# Patient Record
Sex: Male | Born: 1965 | Race: White | Hispanic: No | Marital: Married | Smoking: Former smoker
Health system: Southern US, Community
[De-identification: ages and names within clinical notes are randomized; demographics above are authoritative.]

## PROBLEM LIST (undated history)

## (undated) DIAGNOSIS — F411 Generalized anxiety disorder: Secondary | ICD-10-CM

## (undated) DIAGNOSIS — Z86010 Personal history of colonic polyps: Secondary | ICD-10-CM

## (undated) DIAGNOSIS — Z8719 Personal history of other diseases of the digestive system: Secondary | ICD-10-CM

## (undated) DIAGNOSIS — F329 Major depressive disorder, single episode, unspecified: Secondary | ICD-10-CM

## (undated) DIAGNOSIS — C4491 Basal cell carcinoma of skin, unspecified: Secondary | ICD-10-CM

## (undated) DIAGNOSIS — E785 Hyperlipidemia, unspecified: Secondary | ICD-10-CM

## (undated) DIAGNOSIS — K644 Residual hemorrhoidal skin tags: Secondary | ICD-10-CM

## (undated) DIAGNOSIS — E790 Hyperuricemia without signs of inflammatory arthritis and tophaceous disease: Secondary | ICD-10-CM

## (undated) DIAGNOSIS — M109 Gout, unspecified: Secondary | ICD-10-CM

## (undated) HISTORY — DX: Hyperlipidemia, unspecified: E78.5

## (undated) HISTORY — DX: Generalized anxiety disorder: F41.1

## (undated) HISTORY — DX: Personal history of colonic polyps: Z86.010

## (undated) HISTORY — DX: Hyperuricemia without signs of inflammatory arthritis and tophaceous disease: E79.0

## (undated) HISTORY — DX: Major depressive disorder, single episode, unspecified: F32.9

## (undated) HISTORY — DX: Residual hemorrhoidal skin tags: K64.4

## (undated) HISTORY — PX: COLONOSCOPY: SHX174

## (undated) HISTORY — DX: Personal history of other diseases of the digestive system: Z87.19

## (undated) HISTORY — PX: POLYPECTOMY: SHX149

## (undated) HISTORY — DX: Gout, unspecified: M10.9

## (undated) HISTORY — DX: Basal cell carcinoma of skin, unspecified: C44.91

---

## 2005-10-01 ENCOUNTER — Ambulatory Visit: Payer: Self-pay | Admitting: Internal Medicine

## 2005-10-14 ENCOUNTER — Ambulatory Visit: Payer: Self-pay | Admitting: Internal Medicine

## 2006-03-13 ENCOUNTER — Ambulatory Visit: Payer: Self-pay | Admitting: Internal Medicine

## 2006-10-06 ENCOUNTER — Ambulatory Visit: Payer: Self-pay | Admitting: Internal Medicine

## 2006-10-07 DIAGNOSIS — F411 Generalized anxiety disorder: Secondary | ICD-10-CM

## 2006-10-07 DIAGNOSIS — Z8719 Personal history of other diseases of the digestive system: Secondary | ICD-10-CM | POA: Insufficient documentation

## 2006-10-07 HISTORY — DX: Generalized anxiety disorder: F41.1

## 2006-10-07 HISTORY — DX: Personal history of other diseases of the digestive system: Z87.19

## 2006-10-13 ENCOUNTER — Ambulatory Visit: Payer: Self-pay | Admitting: Internal Medicine

## 2006-10-13 LAB — CONVERTED CEMR LAB
ALT: 25 units/L (ref 0–53)
AST: 23 units/L (ref 0–37)
Albumin: 4.1 g/dL (ref 3.5–5.2)
Alkaline Phosphatase: 83 units/L (ref 39–117)
BUN: 12 mg/dL (ref 6–23)
Basophils Relative: 0.5 % (ref 0.0–1.0)
Crystals: NEGATIVE
Eosinophils Relative: 2 % (ref 0.0–5.0)
GFR calc Af Amer: 106 mL/min
GFR calc non Af Amer: 88 mL/min
HCT: 43.3 % (ref 39.0–52.0)
HDL: 22.9 mg/dL — ABNORMAL LOW (ref >39.0)
Hemoglobin: 15.1 g/dL (ref 13.0–17.0)
Lymphocytes Relative: 22.9 % (ref 12.0–46.0)
Mucus, UA: NEGATIVE
Neutro Abs: 4.6 10*3/uL (ref 1.4–7.7)
Platelets: 179 10*3/uL (ref 150–400)
Potassium: 4.2 meq/L (ref 3.5–5.1)
RBC / HPF: NONE SEEN
Total Bilirubin: 1 mg/dL (ref 0.3–1.2)
Total Protein: 6.8 g/dL (ref 6.0–8.3)
Urine Glucose: NEGATIVE mg/dL
Urobilinogen, UA: 0.2 (ref 0.0–1.0)
VLDL: 59 mg/dL — ABNORMAL HIGH (ref 0–40)
WBC: 6.6 10*3/uL (ref 4.5–10.5)

## 2007-07-10 ENCOUNTER — Ambulatory Visit: Payer: Self-pay | Admitting: Internal Medicine

## 2007-07-10 DIAGNOSIS — M109 Gout, unspecified: Secondary | ICD-10-CM | POA: Insufficient documentation

## 2007-07-10 DIAGNOSIS — Z860100 Personal history of colon polyps, unspecified: Secondary | ICD-10-CM

## 2007-07-10 HISTORY — DX: Personal history of colonic polyps: Z86.010

## 2007-07-10 HISTORY — DX: Personal history of colon polyps, unspecified: Z86.0100

## 2007-07-10 HISTORY — DX: Gout, unspecified: M10.9

## 2007-12-26 ENCOUNTER — Telehealth (INDEPENDENT_AMBULATORY_CARE_PROVIDER_SITE_OTHER): Payer: Self-pay | Admitting: *Deleted

## 2008-02-05 ENCOUNTER — Ambulatory Visit: Payer: Self-pay | Admitting: Gastroenterology

## 2008-03-17 ENCOUNTER — Ambulatory Visit: Payer: Self-pay | Admitting: Gastroenterology

## 2008-03-17 ENCOUNTER — Encounter: Payer: Self-pay | Admitting: Gastroenterology

## 2008-03-17 LAB — HM COLONOSCOPY

## 2008-03-19 ENCOUNTER — Encounter: Payer: Self-pay | Admitting: Gastroenterology

## 2008-09-03 ENCOUNTER — Telehealth: Payer: Self-pay | Admitting: Internal Medicine

## 2008-12-29 ENCOUNTER — Ambulatory Visit: Payer: Self-pay | Admitting: Internal Medicine

## 2008-12-29 DIAGNOSIS — F3289 Other specified depressive episodes: Secondary | ICD-10-CM

## 2008-12-29 DIAGNOSIS — F329 Major depressive disorder, single episode, unspecified: Secondary | ICD-10-CM

## 2008-12-29 DIAGNOSIS — F324 Major depressive disorder, single episode, in partial remission: Secondary | ICD-10-CM | POA: Insufficient documentation

## 2008-12-29 HISTORY — DX: Major depressive disorder, single episode, unspecified: F32.9

## 2008-12-29 HISTORY — DX: Other specified depressive episodes: F32.89

## 2008-12-29 LAB — CONVERTED CEMR LAB
Albumin: 4.3 g/dL (ref 3.5–5.2)
Alkaline Phosphatase: 93 units/L (ref 39–117)
Basophils Absolute: 0.1 10*3/uL (ref 0.0–0.1)
Bilirubin Urine: NEGATIVE
CO2: 31 meq/L (ref 19–32)
Calcium: 9.6 mg/dL (ref 8.4–10.5)
Creatinine, Ser: 0.9 mg/dL (ref 0.4–1.5)
Eosinophils Absolute: 0.2 10*3/uL (ref 0.0–0.7)
Glucose, Bld: 102 mg/dL — ABNORMAL HIGH (ref 70–99)
HDL: 23.8 mg/dL — ABNORMAL LOW (ref >39.00)
Hemoglobin: 16.4 g/dL (ref 13.0–17.0)
Ketones, ur: NEGATIVE mg/dL
Leukocytes, UA: NEGATIVE
Lymphocytes Relative: 23.6 % (ref 12.0–46.0)
MCHC: 35.9 g/dL (ref 30.0–36.0)
MCV: 83.3 fL (ref 78.0–100.0)
Monocytes Absolute: 0.7 10*3/uL (ref 0.1–1.0)
Neutro Abs: 5.4 10*3/uL (ref 1.4–7.7)
Neutrophils Relative %: 65.1 % (ref 43.0–77.0)
RDW: 12.9 % (ref 11.5–14.6)
Specific Gravity, Urine: 1.025 (ref 1.000–1.030)
TSH: 3.29 microintl units/mL (ref 0.35–5.50)
Total Protein, Urine: NEGATIVE mg/dL
Triglycerides: 582 mg/dL — ABNORMAL HIGH (ref 0.0–149.0)
Urine Glucose: NEGATIVE mg/dL
VLDL: 116.4 mg/dL — ABNORMAL HIGH (ref 0.0–40.0)
pH: 5.5 (ref 5.0–8.0)

## 2009-02-12 ENCOUNTER — Telehealth: Payer: Self-pay | Admitting: Internal Medicine

## 2009-03-02 ENCOUNTER — Telehealth: Payer: Self-pay | Admitting: Internal Medicine

## 2009-04-29 ENCOUNTER — Telehealth: Payer: Self-pay | Admitting: Internal Medicine

## 2009-06-03 ENCOUNTER — Telehealth: Payer: Self-pay | Admitting: Internal Medicine

## 2009-06-30 ENCOUNTER — Telehealth: Payer: Self-pay | Admitting: Internal Medicine

## 2009-09-03 ENCOUNTER — Encounter: Payer: Self-pay | Admitting: Internal Medicine

## 2009-09-11 ENCOUNTER — Telehealth: Payer: Self-pay | Admitting: Internal Medicine

## 2010-04-06 NOTE — Progress Notes (Signed)
Summary: RX request  Phone Note Call from Patient Call back at Home Phone [phone]   Caller: Patient Summary of Call: pt called requesting 90 day supply of generic Zoloft to [facility] [address] to cut cost. Initial call taken by: Margaret Pyle, CMA,  April 29, 2009 11:45 AM  Follow-up for Phone Call        ok - to robin to handle Follow-up by: Corwin Levins MD,  April 29, 2009 1:11 PM  Additional Follow-up for Phone Call Additional follow up Details #1::        rx faxed to pharmacy Additional Follow-up by: Margaret Pyle, CMA,  April 29, 2009 2:38 PM    Prescriptions: SERTRALINE HCL 50 MG TABS (SERTRALINE HCL) 1 by mouth once daily  #90 x 2   Entered by:   Margaret Pyle, CMA   Authorized by:   Corwin Levins MD   Signed by:   Margaret Pyle, CMA on 04/29/2009   Method used:   Printed then faxed to ...       [facility] [phone] (retail)       [address]       [place], [place]  [address]       Ph: [phone]       Fax:    RxID:   [id number]

## 2010-04-06 NOTE — Progress Notes (Signed)
Summary: rx request  Phone Note Call from Patient Call back at Home Phone [phone]   Caller: Patient Summary of Call: pt left msg on triage requesting refill for Clonazapam to be sent in to [facility] pharmacy on [facility]. please advise Initial call taken by: Margaret Pyle, CMA,  June 03, 2009 10:21 AM/ Sydell Axon SMA  Follow-up for Phone Call        called pt and informed him that rx could be picked up, left rx in cabinet Follow-up by: Margaret Pyle, CMA,  June 03, 2009 1:57 PM/ Sydell Axon SMA    New/Updated Medications: CLONAZEPAM 0.5 MG TABS (CLONAZEPAM) 1 by mouth qam and 2 by mouth qpm as needed Prescriptions: CLONAZEPAM 0.5 MG TABS (CLONAZEPAM) 1 by mouth qam and 2 by mouth qpm as needed  #90 x 2   Entered and Authorized by:   Corwin Levins MD   Signed by:   Corwin Levins MD on 06/03/2009   Method used:   Print then Give to Patient   RxID:   [id number]  done hardcopy to LIM side B - dahlia  Corwin Levins MD  June 03, 2009 1:30 PM

## 2010-04-06 NOTE — Medication Information (Signed)
Summary: Rx Refill request under new insurance/Patient  Rx Refill request under new insurance/Patient   Imported By: Sherian Rein 09/10/2009 13:40:06  _____________________________________________________________________  External Attachment:    Type:   Image     Comment:   External Document

## 2010-04-06 NOTE — Progress Notes (Signed)
Summary: Rx refill req  Phone Note Refill Request Message from:  Pharmacy on September 11, 2009 2:20 PM  Refills Requested: Medication #1:  ZOLOFT 100 MG TABS 1  and 1/2 by mouth once daily.   Dosage confirmed as above?Dosage Confirmed  Method Requested: Electronic Initial call taken by: Margaret Pyle, CMA,  September 11, 2009 2:20 PM    New/Updated Medications: SERTRALINE HCL 100 MG TABS (SERTRALINE HCL) 1 and 1/2 by mouth once daily Prescriptions: SERTRALINE HCL 100 MG TABS (SERTRALINE HCL) 1 and 1/2 by mouth once daily  #135 x 2   Entered by:   Margaret Pyle, CMA   Authorized by:   Corwin Levins MD   Signed by:   Margaret Pyle, CMA on 09/11/2009   Method used:   Faxed to ...       [facility] (mail-order)             , [place]         Ph: [phone]       Fax: [phone]   RxID:   [id number]

## 2010-04-06 NOTE — Progress Notes (Signed)
Summary: Rx change?  Phone Note Call from Patient   Caller: Patient [phone] Summary of Call: Pt called requesting Rx for Setralline be increased to correct dosage, 100mg  as per phone note 02/12/2009. Okay to change and Rx to [facility] [address]? Initial call taken by: Margaret Pyle, CMA,  June 30, 2009 11:49 AM  Follow-up for Phone Call        done escript Follow-up by: Corwin Levins MD,  June 30, 2009 12:45 PM  Additional Follow-up for Phone Call Additional follow up Details #1::        pt informed Additional Follow-up by: Margaret Pyle, CMA,  June 30, 2009 1:16 PM    New/Updated Medications: ZOLOFT 100 MG TABS (SERTRALINE HCL) 1  and 1/2 by mouth once daily Prescriptions: ZOLOFT 100 MG TABS (SERTRALINE HCL) 1  and 1/2 by mouth once daily  #45 x 11   Entered and Authorized by:   Corwin Levins MD   Signed by:   Corwin Levins MD on 06/30/2009   Method used:   Electronically to        [facility] [address] [address] (retail)       [address]       [place], [place]  [address]       Ph: [phone]       Fax: [phone]   RxID:   [id number] ZOLOFT 100 MG TABS (SERTRALINE HCL) 1  and 1/2 by mouth once daily  #45 x 11   Entered and Authorized by:   Corwin Levins MD   Signed by:   Corwin Levins MD on 06/30/2009   Method used:   [facility]ronically [facility]        [facility]  [address] [phone] (retail[facility]       [address]       [place], [place]  [id number]       Ph: [phone] or [phone]       Fax: [phone]   RxID:   [id number]

## 2010-11-02 ENCOUNTER — Encounter: Payer: Self-pay | Admitting: Internal Medicine

## 2010-11-02 DIAGNOSIS — Z0001 Encounter for general adult medical examination with abnormal findings: Secondary | ICD-10-CM | POA: Insufficient documentation

## 2010-11-05 ENCOUNTER — Ambulatory Visit (INDEPENDENT_AMBULATORY_CARE_PROVIDER_SITE_OTHER): Payer: BC Managed Care – PPO | Admitting: Internal Medicine

## 2010-11-05 ENCOUNTER — Other Ambulatory Visit (INDEPENDENT_AMBULATORY_CARE_PROVIDER_SITE_OTHER): Payer: BC Managed Care – PPO

## 2010-11-05 ENCOUNTER — Encounter: Payer: Self-pay | Admitting: Internal Medicine

## 2010-11-05 ENCOUNTER — Other Ambulatory Visit: Payer: Self-pay | Admitting: Internal Medicine

## 2010-11-05 VITALS — BP 100/62 | HR 77 | Temp 98.9°F | Ht 73.0 in | Wt 266.1 lb

## 2010-11-05 DIAGNOSIS — G4700 Insomnia, unspecified: Secondary | ICD-10-CM | POA: Insufficient documentation

## 2010-11-05 DIAGNOSIS — Z0000 Encounter for general adult medical examination without abnormal findings: Secondary | ICD-10-CM

## 2010-11-05 DIAGNOSIS — F329 Major depressive disorder, single episode, unspecified: Secondary | ICD-10-CM

## 2010-11-05 DIAGNOSIS — M109 Gout, unspecified: Secondary | ICD-10-CM

## 2010-11-05 LAB — TSH: TSH: 1.03 u[IU]/mL (ref 0.35–5.50)

## 2010-11-05 LAB — URINALYSIS, ROUTINE W REFLEX MICROSCOPIC
Bilirubin Urine: NEGATIVE
Hgb urine dipstick: NEGATIVE
Nitrite: NEGATIVE
Specific Gravity, Urine: 1.02 (ref 1.000–1.030)
Total Protein, Urine: NEGATIVE
Urine Glucose: NEGATIVE
Urobilinogen, UA: 0.2 (ref 0.0–1.0)
pH: 5.5 (ref 5.0–8.0)

## 2010-11-05 LAB — CBC WITH DIFFERENTIAL/PLATELET
Basophils Relative: 0.7 % (ref 0.0–3.0)
Eosinophils Relative: 2 % (ref 0.0–5.0)
HCT: 42.7 % (ref 39.0–52.0)
Hemoglobin: 14.5 g/dL (ref 13.0–17.0)
Lymphs Abs: 1.7 10*3/uL (ref 0.7–4.0)
Monocytes Relative: 5.2 % (ref 3.0–12.0)
Neutro Abs: 5.2 10*3/uL (ref 1.4–7.7)
RBC: 5.12 Mil/uL (ref 4.22–5.81)
WBC: 7.5 10*3/uL (ref 4.5–10.5)

## 2010-11-05 LAB — LIPID PANEL
HDL: 27.6 mg/dL — ABNORMAL LOW (ref >39.00)
Triglycerides: 401 mg/dL — ABNORMAL HIGH (ref 0.0–149.0)
VLDL: 80.2 mg/dL — ABNORMAL HIGH (ref 0.0–40.0)

## 2010-11-05 LAB — BASIC METABOLIC PANEL
BUN: 16 mg/dL (ref 6–23)
Calcium: 9.7 mg/dL (ref 8.4–10.5)
GFR: 83.78 mL/min (ref >60.00)
Glucose, Bld: 102 mg/dL — ABNORMAL HIGH (ref 70–99)

## 2010-11-05 LAB — HEPATIC FUNCTION PANEL: Albumin: 4.3 g/dL (ref 3.5–5.2)

## 2010-11-05 LAB — LDL CHOLESTEROL, DIRECT: Direct LDL: 56.9 mg/dL

## 2010-11-05 MED ORDER — SERTRALINE HCL 100 MG PO TABS: ORAL_TABLET | ORAL | Status: DC

## 2010-11-05 MED ORDER — ZOLPIDEM TARTRATE 10 MG PO TABS: 10.0000 mg | ORAL_TABLET | Freq: Every evening | ORAL | Status: DC | PRN

## 2010-11-05 MED ORDER — INDOMETHACIN 50 MG PO CAPS: 50.0000 mg | ORAL_CAPSULE | Freq: Three times a day (TID) | ORAL | Status: AC

## 2010-11-05 NOTE — Patient Instructions (Signed)
Take all new medications as prescribed - the zoloft for nerves, ambien for sleep, and indocin as needed for gout Continue all other medications as before, except stop the clonazepam as you have Please go to LAB in the Basement for the blood and/or urine tests to be done today Please call the phone number [phone] (the PhoneTree System) for results of testing in 2-3 days;  When calling, simply dial the number, and when prompted enter the MRN number above (the Medical Record Number) and the # key, then the message should start. Please return in 1 year for your yearly visit, or sooner if needed, with Lab testing done 3-5 days before

## 2010-11-05 NOTE — Progress Notes (Signed)
Subjective:    Patient ID: Corey Garrett, male    DOB: 05-10-1965, 45 y.o.   MRN: [id number]  HPI Here for wellness and f/u;  Overall doing ok;  Pt denies CP, worsening SOB, DOE, wheezing, orthopnea, PND, worsening LE edema, palpitations, dizziness or syncope.  Pt denies neurological change such as new Headache, facial or extremity weakness.  Pt denies polydipsia, polyuria, or low sugar symptoms. Pt states overall good compliance with treatment and medications, good tolerability, and trying to follow lower cholesterol diet.  Pt denies worsening depressive symptoms, suicidal ideation or panic. No fever, wt loss, night sweats, loss of appetite, or other constitutional symptoms.  Pt states good ability with ADL's, low fall risk, home safety reviewed and adequate, no significant changes in hearing or vision, and occasionally active with exercise. Denies worsening depressive symptoms, suicidal ideation, or panic, though has ongoing anxiety, not increased recently, actually less now that he is back to working in the last 4 months after being out of work for 2 yrs.  Did have gout attack recent in [place] with increased protein intake (fish and steak) and mild ETOH use.   Past Medical History  Diagnosis Date  . ANXIETY 10/07/2006  . COLONIC POLYPS, HX OF 07/10/2007  . DEPRESSION 12/29/2008  . GOUT 07/10/2007  . HEMORRHOIDS, HX OF 10/07/2006   No past surgical history on file.  reports that he has quit smoking. He does not have any smokeless tobacco history on file. He reports that he does not drink alcohol. His drug history not on file. family history includes Anxiety disorder in his other and Autism in his daughter. No Known Allergies No current outpatient prescriptions on file prior to visit.   Review of Systems Review of Systems  Constitutional: Negative for diaphoresis, activity change, appetite change and unexpected weight change.  HENT: Negative for hearing loss, ear pain, facial swelling,  mouth sores and neck stiffness.   Eyes: Negative for pain, redness and visual disturbance.  Respiratory: Negative for shortness of breath and wheezing.   Cardiovascular: Negative for chest pain and palpitations.  Gastrointestinal: Negative for diarrhea, blood in stool, abdominal distention and rectal pain.  Genitourinary: Negative for hematuria, flank pain and decreased urine volume.  Musculoskeletal: Negative for myalgias and joint swelling.  Skin: Negative for color change and wound.  Neurological: Negative for syncope and numbness.  Hematological: Negative for adenopathy.  Psychiatric/Behavioral: Negative for hallucinations, self-injury, decreased concentration and agitation.      Objective:   Physical Exam BP 100/62  Pulse 77  Temp(Src) 98.9 F (37.2 C) (Oral)  Ht 6\' 1"  (1.854 m)  Wt 266 lb 2 oz (120.714 kg)  BMI 35.11 kg/m2  SpO2 98% Physical Exam  VS noted, obese Constitutional: Pt is oriented to person, place, and time. Appears well-developed and well-nourished.  HENT:  Head: Normocephalic and atraumatic.  Right Ear: External ear normal.  Left Ear: External ear normal.  Nose: Nose normal.  Mouth/Throat: Oropharynx is clear and moist.  Eyes: Conjunctivae and EOM are normal. Pupils are equal, round, and reactive to light.  Neck: Normal range of motion. Neck supple. No JVD present. No tracheal deviation present.  Cardiovascular: Normal rate, regular rhythm, normal heart sounds and intact distal pulses.   Pulmonary/Chest: Effort normal and breath sounds normal.  Abdominal: Soft. Bowel sounds are normal. There is no tenderness.  Musculoskeletal: Normal range of motion. Exhibits no edema.  Lymphadenopathy:  Has no cervical adenopathy.  Neurological: Pt is alert and oriented to person,  place, and time. Pt has normal reflexes. No cranial nerve deficit.  Skin: Skin is warm and dry. No rash noted.  Psychiatric:  Behavior is normal.  1+ nervous, somewhat dysphoric           Assessment & Plan:

## 2010-11-05 NOTE — Assessment & Plan Note (Signed)

## 2011-02-28 ENCOUNTER — Telehealth: Payer: Self-pay

## 2011-02-28 DIAGNOSIS — F329 Major depressive disorder, single episode, unspecified: Secondary | ICD-10-CM

## 2011-02-28 MED ORDER — SERTRALINE HCL 100 MG PO TABS: ORAL_TABLET | ORAL | Status: DC

## 2011-02-28 NOTE — Telephone Encounter (Signed)
Called patient left message prescription request has been taken care of and sent to pharmacy. Informed as well MD's instructions regarding Benadryl qhs as well as the ambien prn.

## 2011-02-28 NOTE — Telephone Encounter (Signed)
Pt called stating that Sertraline and Ambien are not helping as much as he was hoping.  He would like to increase sertraline to 2 full tablets a day and says that he sometimes has to take benadryl with ambien for sleep. Please advise.

## 2011-02-28 NOTE — Telephone Encounter (Signed)
Ok to incr the sertraline as he suggests (done per emr)  Ok for benadryl qhs prn as well as the [place] prn

## 2011-05-16 ENCOUNTER — Other Ambulatory Visit: Payer: Self-pay

## 2011-05-16 MED ORDER — ZOLPIDEM TARTRATE 10 MG PO TABS: 10.0000 mg | ORAL_TABLET | Freq: Every evening | ORAL | Status: DC | PRN

## 2011-05-16 NOTE — Telephone Encounter (Signed)
Faxed hardcopy to pharmacy. 

## 2011-05-16 NOTE — Telephone Encounter (Signed)
Done hardcopy to robin  

## 2011-08-22 ENCOUNTER — Other Ambulatory Visit: Payer: Self-pay

## 2011-08-22 MED ORDER — INDOMETHACIN 50 MG PO CAPS: 50.0000 mg | ORAL_CAPSULE | Freq: Three times a day (TID) | ORAL | Status: DC

## 2012-01-02 ENCOUNTER — Encounter: Payer: Self-pay | Admitting: Internal Medicine

## 2012-01-02 ENCOUNTER — Ambulatory Visit (INDEPENDENT_AMBULATORY_CARE_PROVIDER_SITE_OTHER): Payer: BC Managed Care – PPO | Admitting: Internal Medicine

## 2012-01-02 ENCOUNTER — Other Ambulatory Visit: Payer: Self-pay | Admitting: Internal Medicine

## 2012-01-02 ENCOUNTER — Other Ambulatory Visit (INDEPENDENT_AMBULATORY_CARE_PROVIDER_SITE_OTHER): Payer: BC Managed Care – PPO

## 2012-01-02 VITALS — BP 108/70 | HR 79 | Temp 98.3°F | Ht 74.0 in | Wt 262.1 lb

## 2012-01-02 DIAGNOSIS — L989 Disorder of the skin and subcutaneous tissue, unspecified: Secondary | ICD-10-CM

## 2012-01-02 DIAGNOSIS — Z0000 Encounter for general adult medical examination without abnormal findings: Secondary | ICD-10-CM

## 2012-01-02 DIAGNOSIS — F329 Major depressive disorder, single episode, unspecified: Secondary | ICD-10-CM

## 2012-01-02 DIAGNOSIS — G4700 Insomnia, unspecified: Secondary | ICD-10-CM

## 2012-01-02 DIAGNOSIS — G4710 Hypersomnia, unspecified: Secondary | ICD-10-CM

## 2012-01-02 LAB — CBC WITH DIFFERENTIAL/PLATELET
Basophils Absolute: 0 10*3/uL (ref 0.0–0.1)
Eosinophils Relative: 2.2 % (ref 0.0–5.0)
HCT: 46.8 % (ref 39.0–52.0)
Lymphocytes Relative: 18.5 % (ref 12.0–46.0)
Monocytes Relative: 5.9 % (ref 3.0–12.0)
Neutrophils Relative %: 72.9 % (ref 43.0–77.0)
Platelets: 202 10*3/uL (ref 150.0–400.0)
WBC: 8.3 10*3/uL (ref 4.5–10.5)

## 2012-01-02 LAB — URINALYSIS, ROUTINE W REFLEX MICROSCOPIC
Nitrite: NEGATIVE
Specific Gravity, Urine: 1.015 (ref 1.000–1.030)
Urobilinogen, UA: 0.2 (ref 0.0–1.0)
pH: 6 (ref 5.0–8.0)

## 2012-01-02 LAB — PSA: PSA: 0.58 ng/mL (ref 0.10–4.00)

## 2012-01-02 LAB — HEPATIC FUNCTION PANEL
AST: 20 U/L (ref 0–37)
Albumin: 4.1 g/dL (ref 3.5–5.2)
Total Protein: 7.4 g/dL (ref 6.0–8.3)

## 2012-01-02 LAB — BASIC METABOLIC PANEL
BUN: 13 mg/dL (ref 6–23)
CO2: 30 mEq/L (ref 19–32)
GFR: 92.73 mL/min (ref >60.00)
Glucose, Bld: 97 mg/dL (ref 70–99)
Potassium: 4.4 mEq/L (ref 3.5–5.1)

## 2012-01-02 LAB — TSH: TSH: 1.19 u[IU]/mL (ref 0.35–5.50)

## 2012-01-02 LAB — LIPID PANEL
HDL: 24.1 mg/dL — ABNORMAL LOW (ref >39.00)
Triglycerides: 559 mg/dL — ABNORMAL HIGH (ref 0.0–149.0)

## 2012-01-02 LAB — LDL CHOLESTEROL, DIRECT: Direct LDL: 62.5 mg/dL

## 2012-01-02 MED ORDER — ZOLPIDEM TARTRATE 10 MG PO TABS: 10.0000 mg | ORAL_TABLET | Freq: Every evening | ORAL | Status: DC | PRN

## 2012-01-02 MED ORDER — SERTRALINE HCL 100 MG PO TABS: ORAL_TABLET | ORAL | Status: DC

## 2012-01-02 MED ORDER — FENOFIBRATE 160 MG PO TABS: 160.0000 mg | ORAL_TABLET | Freq: Every day | ORAL | Status: DC

## 2012-01-02 MED ORDER — INDOMETHACIN 50 MG PO CAPS: 50.0000 mg | ORAL_CAPSULE | Freq: Three times a day (TID) | ORAL | Status: DC

## 2012-01-02 NOTE — Addendum Note (Signed)
Addended by: Corwin Levins on: 01/02/2012 12:43 PM   Modules accepted: Orders

## 2012-01-02 NOTE — Assessment & Plan Note (Signed)
?   Basal cell vs other - for derm referral

## 2012-01-02 NOTE — Progress Notes (Signed)
Subjective:    Patient ID: Corey Garrett, male    DOB: 10/04/1965, 46 y.o.   MRN: [id number]  HPI  Here for wellness and f/u;  Overall doing ok;  Pt denies CP, worsening SOB, DOE, wheezing, orthopnea, PND, worsening LE edema, palpitations, dizziness or syncope.  Pt denies neurological change such as new Headache, facial or extremity weakness.  Pt denies polydipsia, polyuria, or low sugar symptoms. Pt states overall good compliance with treatment and medications, good tolerability, and trying to follow lower cholesterol diet.  Pt denies worsening depressive symptoms, suicidal ideation or panic. No fever, wt loss, night sweats, loss of appetite, or other constitutional symptoms.  Pt states good ability with ADL's, low fall risk, home safety reviewed and adequate, no significant changes in hearing or vision, and occasionally active with exercise.  Has had more stress financially though as son has college covered.  Has lost wt with the stress and admits to ongoing lack of exercise and less than optimal diet.  Travels a lot with higher fat diet with a collegue.  Has not been taking the zoloft 200 qd regularly though plans to try to do better with compliance, has had increased ? Depressed, but also Does have sense of ongoing fatigue, but has signficant hypersomnolence daytime as well.  Works at home, tends to sit in the home office on the computer for many hrs per day.  Snoring and thrashing on his part causes him to sleep about 75% in a different room at night Past Medical History  Diagnosis Date  . ANXIETY 10/07/2006  . COLONIC POLYPS, HX OF 07/10/2007  . DEPRESSION 12/29/2008  . GOUT 07/10/2007  . HEMORRHOIDS, HX OF 10/07/2006   No past surgical history on file.  reports that he has quit smoking. He does not have any smokeless tobacco history on file. He reports that he does not drink alcohol. His drug history not on file. family history includes Anxiety disorder in his other and Autism in his daughter. No  Known Allergies Current Outpatient Prescriptions on File Prior to Visit  Medication Sig Dispense Refill  . indomethacin (INDOCIN) 50 MG capsule Take 1 capsule (50 mg total) by mouth 3 (three) times daily with meals.  90 capsule  0  . sertraline (ZOLOFT) 100 MG tablet 2 tabs by mouth per day  150 tablet  3  . zolpidem (AMBIEN) 10 MG tablet Take 1 tablet (10 mg total) by mouth at bedtime as needed for sleep.  30 tablet  5  . zolpidem (AMBIEN) 10 MG tablet Take 1 tablet (10 mg total) by mouth at bedtime as needed for sleep.  30 tablet  5  . DISCONTD: sertraline (ZOLOFT) 100 MG tablet 1 and 1/2 by mouth once daily  180 tablet  3   Review of Systems Review of Systems  Constitutional: Negative for diaphoresis, activity change, appetite change and unexpected weight change.  HENT: Negative for hearing loss, ear pain, facial swelling, mouth sores and neck stiffness.   Eyes: Negative for pain, redness and visual disturbance.  Respiratory: Negative for shortness of breath and wheezing.   Cardiovascular: Negative for chest pain and palpitations.  Gastrointestinal: Negative for diarrhea, blood in stool, abdominal distention and rectal pain.  Genitourinary: Negative for hematuria, flank pain and decreased urine volume.  Musculoskeletal: Negative for myalgias and joint swelling.  Skin: Negative for color change and wound.  Neurological: Negative for syncope and numbness.  Hematological: Negative for adenopathy.  Psychiatric/Behavioral: Negative for hallucinations, self-injury, decreased concentration and  agitation.      Objective:   Physical Exam BP 108/70  Pulse 79  Temp 98.3 F (36.8 C) (Oral)  Ht 6\' 2"  (1.88 m)  Wt 262 lb 2 oz (118.899 kg)  BMI 33.65 kg/m2  SpO2 95% Physical Exam  VS noted Constitutional: Pt is oriented to person, place, and time. Appears well-developed and well-nourished. Lavella Lemons HENT:  Head: Normocephalic and atraumatic.  Right Ear: External ear normal.  Left Ear:  External ear normal.  Nose: Nose normal.  Mouth/Throat: Oropharynx is clear and moist.  Eyes: Conjunctivae and EOM are normal. Pupils are equal, round, and reactive to light.  Neck: Normal range of motion. Neck supple. No JVD present. No tracheal deviation present.  Cardiovascular: Normal rate, regular rhythm, normal heart sounds and intact distal pulses.   Pulmonary/Chest: Effort normal and breath sounds normal.  Abdominal: Soft. Bowel sounds are normal. There is no tenderness.  Musculoskeletal: Normal range of motion. Exhibits no edema.  Lymphadenopathy:  Has no cervical adenopathy.  Neurological: Pt is alert and oriented to person, place, and time. Pt has normal reflexes. No cranial nerve deficit.  Skin: Skin is warm and dry. No rash noted.  Skin lesion approx 1 cm, raised, scaly ? Mild irritated/ulcerated to right mid back below the scapula Psychiatric:  Has  Dysphoric mood and affect. Behavior is normal.     Assessment & Plan:

## 2012-01-02 NOTE — Assessment & Plan Note (Signed)
To re-start the zoloft, declines counseling, verified nonsuicidal

## 2012-01-02 NOTE — Patient Instructions (Addendum)
Please Continue all other medications as before Your [facility] You will be contacted regarding the referral for: dermatology, and pulmonary Please go to LAB in the Basement for the blood and/or urine tests to be done today You will be contacted by phone if any changes need to be made immediately.  Otherwise, you will receive a letter about your results with an explanation. Please remember to sign up for My Chart at your earliest convenience, as this will be important to you in the future with finding out test results. Please return in 1 year for your yearly visit, or sooner if needed, with Lab testing done 3-5 days before  OK to cancel the Jan 2014 appt

## 2012-01-02 NOTE — Assessment & Plan Note (Signed)
Overall doing well, age appropriate education and counseling updated, referrals for preventative services and immunizations addressed, dietary and smoking counseling addressed, most recent labs and ECG reviewed.  I have personally reviewed and have noted: 1) the patient's medical and social history 2) The pt's use of alcohol, tobacco, and illicit drugs 3) The patient's current medications and supplements 4) Functional ability including ADL's, fall risk, home safety risk, hearing and visual impairment 5) Diet and physical activities 6) Evidence for depression or mood disorder 7) The patient's height, weight, and BMI have been recorded in the chart I have made referrals, and provided counseling and education based on review of the above For labs today, o/w up to date

## 2012-01-02 NOTE — Assessment & Plan Note (Signed)
?   [facility] vs plmd - for pulm referral

## 2012-01-04 ENCOUNTER — Other Ambulatory Visit: Payer: Self-pay

## 2012-01-05 ENCOUNTER — Institutional Professional Consult (permissible substitution): Payer: BC Managed Care – PPO | Admitting: Internal Medicine

## 2012-02-13 ENCOUNTER — Institutional Professional Consult (permissible substitution): Payer: BC Managed Care – PPO | Admitting: Internal Medicine

## 2012-03-13 ENCOUNTER — Encounter: Payer: BC Managed Care – PPO | Admitting: Internal Medicine

## 2013-02-15 ENCOUNTER — Encounter: Payer: Self-pay | Admitting: Gastroenterology

## 2013-03-07 ENCOUNTER — Other Ambulatory Visit: Payer: Self-pay | Admitting: Internal Medicine

## 2013-03-12 ENCOUNTER — Other Ambulatory Visit: Payer: Self-pay | Admitting: Internal Medicine

## 2013-03-12 NOTE — Addendum Note (Signed)
Addended by: Biagio Borg on: 03/12/2013 07:52 PM   Modules accepted: Orders

## 2013-03-12 NOTE — Telephone Encounter (Signed)
Patient phoned in again, requesting refills for sertraline & his steroid.  Please advise.  Patient has not been seen by you since 01/02/2012.  Please advise.

## 2013-03-12 NOTE — Telephone Encounter (Signed)
  Received electronic script for zoloft sent # 30 until see md.../lmb

## 2013-03-12 NOTE — Telephone Encounter (Addendum)
Done erx  To let pt now - due for ROV

## 2013-04-03 ENCOUNTER — Encounter: Payer: Self-pay | Admitting: Internal Medicine

## 2013-04-03 ENCOUNTER — Other Ambulatory Visit (INDEPENDENT_AMBULATORY_CARE_PROVIDER_SITE_OTHER): Payer: BC Managed Care – PPO

## 2013-04-03 ENCOUNTER — Ambulatory Visit (INDEPENDENT_AMBULATORY_CARE_PROVIDER_SITE_OTHER): Payer: BC Managed Care – PPO | Admitting: Internal Medicine

## 2013-04-03 VITALS — BP 118/72 | HR 87 | Temp 97.0°F | Ht 73.0 in | Wt 276.4 lb

## 2013-04-03 DIAGNOSIS — R7309 Other abnormal glucose: Secondary | ICD-10-CM

## 2013-04-03 DIAGNOSIS — R7302 Impaired glucose tolerance (oral): Secondary | ICD-10-CM

## 2013-04-03 DIAGNOSIS — F17200 Nicotine dependence, unspecified, uncomplicated: Secondary | ICD-10-CM

## 2013-04-03 DIAGNOSIS — E790 Hyperuricemia without signs of inflammatory arthritis and tophaceous disease: Secondary | ICD-10-CM

## 2013-04-03 DIAGNOSIS — Z860100 Personal history of colon polyps, unspecified: Secondary | ICD-10-CM

## 2013-04-03 DIAGNOSIS — F329 Major depressive disorder, single episode, unspecified: Secondary | ICD-10-CM

## 2013-04-03 DIAGNOSIS — G4700 Insomnia, unspecified: Secondary | ICD-10-CM

## 2013-04-03 DIAGNOSIS — C4491 Basal cell carcinoma of skin, unspecified: Secondary | ICD-10-CM | POA: Insufficient documentation

## 2013-04-03 DIAGNOSIS — F3289 Other specified depressive episodes: Secondary | ICD-10-CM

## 2013-04-03 DIAGNOSIS — Z0000 Encounter for general adult medical examination without abnormal findings: Secondary | ICD-10-CM

## 2013-04-03 DIAGNOSIS — F1011 Alcohol abuse, in remission: Secondary | ICD-10-CM

## 2013-04-03 HISTORY — DX: Hyperuricemia without signs of inflammatory arthritis and tophaceous disease: E79.0

## 2013-04-03 LAB — URINALYSIS, ROUTINE W REFLEX MICROSCOPIC
BILIRUBIN URINE: NEGATIVE
Hgb urine dipstick: NEGATIVE
KETONES UR: NEGATIVE
Leukocytes, UA: NEGATIVE
NITRITE: NEGATIVE
PH: 6 (ref 5.0–8.0)
Specific Gravity, Urine: 1.02 (ref 1.000–1.030)
TOTAL PROTEIN, URINE-UPE24: NEGATIVE
URINE GLUCOSE: NEGATIVE
Urobilinogen, UA: 0.2 (ref 0.0–1.0)

## 2013-04-03 LAB — LIPID PANEL
Cholesterol: 154 mg/dL (ref 0–200)
HDL: 26.9 mg/dL — AB (ref >39.00)
Total CHOL/HDL Ratio: 6
Triglycerides: 322 mg/dL — ABNORMAL HIGH (ref 0.0–149.0)
VLDL: 64.4 mg/dL — AB (ref 0.0–40.0)

## 2013-04-03 LAB — BASIC METABOLIC PANEL
BUN: 16 mg/dL (ref 6–23)
CALCIUM: 9.6 mg/dL (ref 8.4–10.5)
CO2: 30 mEq/L (ref 19–32)
Chloride: 103 mEq/L (ref 96–112)
Creatinine, Ser: 1 mg/dL (ref 0.4–1.5)
GFR: 87.86 mL/min (ref >60.00)
Glucose, Bld: 85 mg/dL (ref 70–99)
Potassium: 4.2 mEq/L (ref 3.5–5.1)
Sodium: 139 mEq/L (ref 135–145)

## 2013-04-03 LAB — CBC WITH DIFFERENTIAL/PLATELET
BASOS PCT: 0.8 % (ref 0.0–3.0)
Basophils Absolute: 0.1 10*3/uL (ref 0.0–0.1)
Eosinophils Absolute: 0.1 10*3/uL (ref 0.0–0.7)
Eosinophils Relative: 1.4 % (ref 0.0–5.0)
HEMATOCRIT: 43.9 % (ref 39.0–52.0)
Hemoglobin: 14.7 g/dL (ref 13.0–17.0)
LYMPHS ABS: 1.3 10*3/uL (ref 0.7–4.0)
Lymphocytes Relative: 15.1 % (ref 12.0–46.0)
MCHC: 33.6 g/dL (ref 30.0–36.0)
MCV: 81.2 fl (ref 78.0–100.0)
MONO ABS: 0.4 10*3/uL (ref 0.1–1.0)
Monocytes Relative: 5 % (ref 3.0–12.0)
NEUTROS ABS: 7 10*3/uL (ref 1.4–7.7)
Neutrophils Relative %: 77.7 % — ABNORMAL HIGH (ref 43.0–77.0)
Platelets: 187 10*3/uL (ref 150.0–400.0)
RBC: 5.41 Mil/uL (ref 4.22–5.81)
RDW: 15.2 % — ABNORMAL HIGH (ref 11.5–14.6)
WBC: 9 10*3/uL (ref 4.5–10.5)

## 2013-04-03 LAB — HEPATIC FUNCTION PANEL
ALBUMIN: 4.4 g/dL (ref 3.5–5.2)
ALK PHOS: 111 U/L (ref 39–117)
ALT: 19 U/L (ref 0–53)
AST: 22 U/L (ref 0–37)
Bilirubin, Direct: 0.1 mg/dL (ref 0.0–0.3)
TOTAL PROTEIN: 7.5 g/dL (ref 6.0–8.3)
Total Bilirubin: 0.6 mg/dL (ref 0.3–1.2)

## 2013-04-03 LAB — LDL CHOLESTEROL, DIRECT: LDL DIRECT: 81.5 mg/dL

## 2013-04-03 LAB — PSA: PSA: 0.4 ng/mL (ref 0.10–4.00)

## 2013-04-03 LAB — TSH: TSH: 1.14 u[IU]/mL (ref 0.35–5.50)

## 2013-04-03 LAB — HEMOGLOBIN A1C: Hgb A1c MFr Bld: 5.3 % (ref 4.6–6.5)

## 2013-04-03 MED ORDER — FENOFIBRATE 160 MG PO TABS: 160.0000 mg | ORAL_TABLET | Freq: Every day | ORAL | Status: DC

## 2013-04-03 MED ORDER — INDOMETHACIN 50 MG PO CAPS: ORAL_CAPSULE | ORAL | Status: DC

## 2013-04-03 MED ORDER — ZOLPIDEM TARTRATE ER 12.5 MG PO TBCR: 12.5000 mg | EXTENDED_RELEASE_TABLET | Freq: Every evening | ORAL | Status: DC | PRN

## 2013-04-03 MED ORDER — SERTRALINE HCL 100 MG PO TABS: ORAL_TABLET | ORAL | Status: DC

## 2013-04-03 NOTE — Progress Notes (Signed)
Pre-visit discussion using our clinic review tool. No additional management support is needed unless otherwise documented below in the visit note.  

## 2013-04-03 NOTE — Progress Notes (Signed)
Subjective:    Patient ID: Corey Garrett, male    DOB: [birth year], 48 y.o.   MRN: [id number]  HPI  Here for wellness and f/u;  Overall doing ok;  Pt denies CP, worsening SOB, DOE, wheezing, orthopnea, PND, worsening LE edema, palpitations, dizziness or syncope.  Pt denies neurological change such as new headache, facial or extremity weakness.  Pt denies polydipsia, polyuria, or low sugar symptoms. Pt states overall good compliance with treatment and medications, good tolerability, and has been trying to follow lower cholesterol diet.  Pt denies worsening depressive symptoms, suicidal ideation or panic. No fever, night sweats, wt loss, loss of appetite, or other constitutional symptoms.  Pt states good ability with ADL's, has low fall risk, home safety reviewed and adequate, no other significant changes in hearing or vision, and only occasionally active with exercise. Had family crisis recently with emotionally ill osn, had lost 20 lbs but put it back on. Trying to eat better as well, but traveling for work makes it difficult.. Has a stat bike at home, plans to get treadmill.  Did have recent left foot gout pain and swelling, now resolved. Did have basal cell ca removed from back last yr. Still smoking 34 cigars per mo, and trying to cut back on ETOH.   Past Medical History  Diagnosis Date  . ANXIETY 10/07/2006  . COLONIC POLYPS, HX OF 07/10/2007  . DEPRESSION 12/29/2008  . GOUT 07/10/2007  . HEMORRHOIDS, HX OF 10/07/2006   No past surgical history on file.  reports that he has quit smoking. He does not have any smokeless tobacco history on file. He reports that he does not drink alcohol. His drug history is not on file. family history includes Anxiety disorder in his other; Autism in his daughter. No Known Allergies Current Outpatient Prescriptions on File Prior to Visit  Medication Sig Dispense Refill  . indomethacin (INDOCIN) 50 MG capsule TAKE 1 CAPSULE (50 MG TOTAL) BY MOUTH 3 (THREE) TIMES DAILY  WITH MEALS.  90 capsule  0  . sertraline (ZOLOFT) 100 MG tablet 2 tabs by mouth per day  180 tablet  3  . sertraline (ZOLOFT) 100 MG tablet TAKE 2 TABLETS DAILY  60 tablet  0   No current facility-administered medications on file prior to visit.   Review of Systems Constitutional: Negative for diaphoresis, activity change, appetite change or unexpected weight change.  HENT: Negative for hearing loss, ear pain, facial swelling, mouth sores and neck stiffness.   Eyes: Negative for pain, redness and visual disturbance.  Respiratory: Negative for shortness of breath and wheezing.   Cardiovascular: Negative for chest pain and palpitations.  Gastrointestinal: Negative for diarrhea, blood in stool, abdominal distention or other pain Genitourinary: Negative for hematuria, flank pain or change in urine volume.  Musculoskeletal: Negative for myalgias and joint swelling.  Skin: Negative for color change and wound.  Neurological: Negative for syncope and numbness. other than noted Hematological: Negative for adenopathy.  Psychiatric/Behavioral: Negative for hallucinations, self-injury, decreased concentration and agitation.      Objective:   Physical Exam BP 118/72  Pulse 87  Temp(Src) 97 F (36.1 C) (Oral)  Ht 6\' 1"  (1.854 m)  Wt 276 lb 6 oz (125.363 kg)  BMI 36.47 kg/m2  SpO2 94% VS noted,  Constitutional: Pt is oriented to person, place, and time. Appears well-developed and well-nourished.  Head: Normocephalic and atraumatic.  Right Ear: External ear normal.  Left Ear: External ear normal.  Nose: Nose normal.  Mouth/Throat:  Oropharynx is clear and moist.  Eyes: Conjunctivae and EOM are normal. Pupils are equal, round, and reactive to light.  Neck: Normal range of motion. Neck supple. No JVD present. No tracheal deviation present.  Cardiovascular: Normal rate, regular rhythm, normal heart sounds and intact distal pulses.   Pulmonary/Chest: Effort normal and breath sounds normal.    Abdominal: Soft. Bowel sounds are normal. There is no tenderness. No HSM  Musculoskeletal: Normal range of motion. Exhibits no edema.  Lymphadenopathy:  Has no cervical adenopathy.  Neurological: Pt is alert and oriented to person, place, and time. Pt has normal reflexes. No cranial nerve deficit.  Skin: Skin is warm and dry. No rash noted.  Psychiatric:  Has  normal mood and affect. Behavior is normal.     Assessment & Plan:

## 2013-04-03 NOTE — Assessment & Plan Note (Signed)

## 2013-04-03 NOTE — Assessment & Plan Note (Signed)
Urged to quit 

## 2013-04-03 NOTE — Assessment & Plan Note (Signed)
Ok to change the [place] 10 to the CR 12.5 mg qhs prn - Done hardcopy

## 2013-04-03 NOTE — Patient Instructions (Addendum)
Please take all new medication as prescribed - the ambien 12.5 mg as needed You can also consider taking otc Melatonin for this Please continue all other medications as before, and refills have been done if requested. Please quit smoking Please drink alcohol in moderation, if at all Please continue your efforts at being more active, low cholesterol diet, and weight control. You are otherwise up to date with prevention measures today. You will be contacted regarding the referral for: colonoscopy, as you are due  Please go to the LAB in the Basement (turn left off the elevator) for the tests to be done today You will be contacted by phone if any changes need to be made immediately.  Otherwise, you will receive a letter about your results with an explanation, but please check with MyChart first.  Please remember to sign up for My Chart if you have not done so, as this will be important to you in the future with finding out test results, communicating by private email, and scheduling acute appointments online when needed.  Please return in 1 year for your yearly visit, or sooner if needed, with Lab testing done 3-5 days before

## 2013-04-03 NOTE — Assessment & Plan Note (Signed)
stable overall by history and exam, recent data reviewed with pt, and pt to continue medical treatment as before,  to f/u any worsening symptoms or concerns  

## 2013-04-11 ENCOUNTER — Encounter: Payer: Self-pay | Admitting: Gastroenterology

## 2013-05-28 ENCOUNTER — Ambulatory Visit (AMBULATORY_SURGERY_CENTER): Payer: BC Managed Care – PPO

## 2013-05-28 VITALS — Ht 73.0 in | Wt 279.6 lb

## 2013-05-28 DIAGNOSIS — Z86010 Personal history of colonic polyps: Secondary | ICD-10-CM

## 2013-05-28 MED ORDER — PREPOPIK 10-3.5-12 MG-GM-GM PO PACK: 1.0000 | PACK | ORAL | Status: DC

## 2013-05-29 ENCOUNTER — Encounter: Payer: Self-pay | Admitting: Gastroenterology

## 2013-06-11 ENCOUNTER — Ambulatory Visit (AMBULATORY_SURGERY_CENTER): Payer: 59 | Admitting: Gastroenterology

## 2013-06-11 ENCOUNTER — Encounter: Payer: Self-pay | Admitting: Gastroenterology

## 2013-06-11 VITALS — BP 99/57 | HR 67 | Temp 98.2°F | Resp 22 | Ht 73.0 in | Wt 279.0 lb

## 2013-06-11 DIAGNOSIS — D126 Benign neoplasm of colon, unspecified: Secondary | ICD-10-CM

## 2013-06-11 DIAGNOSIS — Z800 Family history of malignant neoplasm of digestive organs: Secondary | ICD-10-CM

## 2013-06-11 DIAGNOSIS — Z86010 Personal history of colonic polyps: Secondary | ICD-10-CM

## 2013-06-11 MED ORDER — SODIUM CHLORIDE 0.9 % IV SOLN: 500.0000 mL | INTRAVENOUS | Status: DC

## 2013-06-11 NOTE — Op Note (Signed)
[address] [place], [address]   COLONOSCOPY PROCEDURE REPORT  PATIENT: Corey Garrett, Corey Garrett  MR#: [id number] BIRTHDATE: September 11, 1965 , 48  yrs. old GENDER: Male ENDOSCOPIST: Ladene Artist, MD, [facility] PROCEDURE DATE:  06/11/2013 PROCEDURE:   Colonoscopy with snare polypectomy First Screening Colonoscopy - Avg.  risk and is 50 yrs.  old or older - No.  Prior Negative Screening - Now for repeat screening. Above average risk  History of Adenoma - Now for follow-up colonoscopy & has been > or = to 3 yrs.  N/A  Polyps Removed Today? Yes. ASA CLASS:   Class II INDICATIONS:Patient's immediate family history of colon cancer-father. MEDICATIONS: MAC sedation, administered by CRNA and propofol (Diprivan) 400mg  IV DESCRIPTION OF PROCEDURE:   After the risks benefits and alternatives of the procedure were thoroughly explained, informed consent was obtained.  A digital rectal exam revealed no abnormalities of the rectum.   The [id number]  endoscope was introduced through the anus and advanced to the cecum, which was identified by both the appendix and ileocecal valve. No adverse events experienced.   The quality of the prep was Prepopik adequate The instrument was then slowly withdrawn as the colon was fully examined.  COLON FINDINGS: Two sessile polyps measuring 5-6 mm in size were found in the ascending colon.  A polypectomy was performed with a cold snare.  The resection was complete and the polyp tissue was completely retrieved.   The colon was otherwise normal.  There was no diverticulosis, inflammation, polyps or cancers unless previously stated.  Retroflexed views revealed small internal hemorrhoids. The time to cecum=6 minutes 40 seconds.  Withdrawal time=14 minutes 25 seconds.  The scope was withdrawn and the procedure completed.  COMPLICATIONS: There were no complications.  ENDOSCOPIC IMPRESSION: 1.   Two sessile polyps measuring 5-6 mm in  the ascending colon; polypectomy performed with a cold snare 2.   Small internal hemorrhoids  RECOMMENDATIONS: 1.  Await pathology results 2.  Repeat Colonoscopy in 5 years with a more extensive bowel prep.  eSigned:  Ladene Artist, MD, [facility] 06/11/2013 8:57 AM

## 2013-06-11 NOTE — Progress Notes (Signed)
Lidocaine-40mg IV prior to Propofol InductionPropofol given over incremental dosages 

## 2013-06-11 NOTE — Patient Instructions (Signed)
YOU HAD AN ENDOSCOPIC PROCEDURE TODAY AT THE [facility]: Refer to the procedure report that was given to you for any specific questions about what was found during the examination.  If the procedure report does not answer your questions, please call your gastroenterologist to clarify.  If you requested that your care partner not be given the details of your procedure findings, then the procedure report has been included in a sealed envelope for you to review at your convenience later.  YOU SHOULD EXPECT: Some feelings of bloating in the abdomen. Passage of more gas than usual.  Walking can help get rid of the air that was put into your GI tract during the procedure and reduce the bloating. If you had a lower endoscopy (such as a colonoscopy or flexible sigmoidoscopy) you may notice spotting of blood in your stool or on the toilet paper. If you underwent a bowel prep for your procedure, then you may not have a normal bowel movement for a few days.  DIET: Your first meal following the procedure should be a light meal and then it is ok to progress to your normal diet.  A half-sandwich or bowl of soup is an example of a good first meal.  Heavy or fried foods are harder to digest and may make you feel nauseous or bloated.  Likewise meals heavy in dairy and vegetables can cause extra gas to form and this can also increase the bloating.  Drink plenty of fluids but you should avoid alcoholic beverages for 24 hours.  ACTIVITY: Your care partner should take you home directly after the procedure.  You should plan to take it easy, moving slowly for the rest of the day.  You can resume normal activity the day after the procedure however you should NOT DRIVE or use heavy machinery for 24 hours (because of the sedation medicines used during the test).    SYMPTOMS TO REPORT IMMEDIATELY: A gastroenterologist can be reached at any hour.  During normal business hours, 8:30 AM to 5:00 PM Monday through Friday,  call [phone].  After hours and on weekends, please call the GI answering service at [phone] who will take a message and have the physician on call contact you.   Following lower endoscopy (colonoscopy or flexible sigmoidoscopy):  Excessive amounts of blood in the stool  Significant tenderness or worsening of abdominal pains  Swelling of the abdomen that is new, acute  Fever of 100F or higher  FOLLOW UP: If any biopsies were taken you will be contacted by phone or by letter within the next 1-3 weeks.  Call your gastroenterologist if you have not heard about the biopsies in 3 weeks.  Our staff will call the home number listed on your records the next business day following your procedure to check on you and address any questions or concerns that you may have at that time regarding the information given to you following your procedure. This is a courtesy call and so if there is no answer at the home number and we have not heard from you through the emergency physician on call, we will assume that you have returned to your regular daily activities without incident.  SIGNATURES/CONFIDENTIALITY: You and/or your care partner have signed paperwork which will be entered into your electronic medical record.  These signatures attest to the fact that that the information above on your After Visit Summary has been reviewed and is understood.  Full responsibility of the confidentiality of this   discharge information lies with you and/or your care-partner.  Continue your normal medications  Please read handouts about polyps, hemorrhoids, and high fiber diets  Await pathology- follow up colonoscopy in 5 years

## 2013-06-11 NOTE — Progress Notes (Signed)
Called to room to assist during endoscopic procedure.  Patient ID and intended procedure confirmed with present staff. Received instructions for my participation in the procedure from the performing physician.  

## 2013-06-12 ENCOUNTER — Telehealth: Payer: Self-pay

## 2013-06-12 NOTE — Telephone Encounter (Signed)
  Follow up Call-  Call back number 06/11/2013  Post procedure Call Back phone  # [phone]  Permission to leave phone message Yes     Patient questions:  Do you have a fever, pain , or abdominal swelling? no Pain Score  0 *  Have you tolerated food without any problems? yes  Have you been able to return to your normal activities? yes  Do you have any questions about your discharge instructions: Diet   no Medications  no Follow up visit  no  Do you have questions or concerns about your Care? no  Actions: * If pain score is 4 or above: No action needed, pain <4.  No problems per the pt. Maw

## 2013-06-17 ENCOUNTER — Encounter: Payer: Self-pay | Admitting: Gastroenterology

## 2013-09-12 ENCOUNTER — Telehealth: Payer: Self-pay | Admitting: *Deleted

## 2013-09-12 NOTE — Telephone Encounter (Signed)
Prior Auth for Zolpidem ER 12.5mg  initiated-form completed & faxed.

## 2013-09-13 NOTE — Telephone Encounter (Signed)
Once letter is sent will notify patient and pharmacy of result.

## 2013-09-16 NOTE — Telephone Encounter (Signed)
Received Denial for PA for Zolpidem tart ER 12.5 mg. Covered alternatives are generic lunesta, generic Sonata and generic Ambien.  Advise

## 2013-09-17 MED ORDER — ZOLPIDEM TARTRATE 10 MG PO TABS: 10.0000 mg | ORAL_TABLET | Freq: Every evening | ORAL | Status: AC | PRN

## 2013-09-17 NOTE — Addendum Note (Signed)
Addended by: Biagio Borg on: 09/17/2013 01:15 PM   Modules accepted: Orders, Medications

## 2013-09-17 NOTE — Telephone Encounter (Addendum)
Done hardcopy to robin - hopefully ok with pt to try change ot ambien 10

## 2013-09-17 NOTE — Telephone Encounter (Signed)
Called the patient and is ok with him to change.  Faxed to [facility]

## 2013-09-21 ENCOUNTER — Other Ambulatory Visit: Payer: Self-pay | Admitting: Internal Medicine

## 2013-10-24 ENCOUNTER — Telehealth: Payer: Self-pay

## 2013-10-24 DIAGNOSIS — F329 Major depressive disorder, single episode, unspecified: Secondary | ICD-10-CM

## 2013-10-24 DIAGNOSIS — F3289 Other specified depressive episodes: Secondary | ICD-10-CM

## 2013-10-24 MED ORDER — INDOMETHACIN 50 MG PO CAPS: 50.0000 mg | ORAL_CAPSULE | Freq: Three times a day (TID) | ORAL | Status: DC

## 2013-10-24 MED ORDER — ZOLPIDEM TARTRATE 10 MG PO TABS: 10.0000 mg | ORAL_TABLET | Freq: Every evening | ORAL | Status: AC | PRN

## 2013-10-24 MED ORDER — SERTRALINE HCL 100 MG PO TABS: ORAL_TABLET | ORAL | Status: DC

## 2013-10-24 MED ORDER — FENOFIBRATE 160 MG PO TABS: 160.0000 mg | ORAL_TABLET | Freq: Every day | ORAL | Status: DC

## 2013-10-24 NOTE — Telephone Encounter (Signed)
Fax refill request for Zolpidem Advise as is not on current list.

## 2013-10-24 NOTE — Telephone Encounter (Signed)
Done hardcopy to robin  

## 2013-10-25 NOTE — Telephone Encounter (Signed)
Faxed hardcopy to mail order.

## 2013-11-02 ENCOUNTER — Other Ambulatory Visit: Payer: Self-pay | Admitting: Internal Medicine

## 2013-11-06 DIAGNOSIS — Z0279 Encounter for issue of other medical certificate: Secondary | ICD-10-CM

## 2013-12-20 ENCOUNTER — Other Ambulatory Visit: Payer: Self-pay | Admitting: Internal Medicine

## 2014-01-03 ENCOUNTER — Ambulatory Visit (INDEPENDENT_AMBULATORY_CARE_PROVIDER_SITE_OTHER): Payer: 59 | Admitting: Physician Assistant

## 2014-01-03 VITALS — BP 128/82 | HR 75 | Temp 98.3°F | Resp 16 | Ht 73.5 in | Wt 277.0 lb

## 2014-01-03 DIAGNOSIS — Z1159 Encounter for screening for other viral diseases: Secondary | ICD-10-CM

## 2014-01-03 DIAGNOSIS — Z114 Encounter for screening for human immunodeficiency virus [HIV]: Secondary | ICD-10-CM

## 2014-01-03 DIAGNOSIS — Z0000 Encounter for general adult medical examination without abnormal findings: Secondary | ICD-10-CM

## 2014-01-03 DIAGNOSIS — Z113 Encounter for screening for infections with a predominantly sexual mode of transmission: Secondary | ICD-10-CM

## 2014-01-03 DIAGNOSIS — Z13220 Encounter for screening for lipoid disorders: Secondary | ICD-10-CM

## 2014-01-03 DIAGNOSIS — B351 Tinea unguium: Secondary | ICD-10-CM

## 2014-01-03 DIAGNOSIS — Z1329 Encounter for screening for other suspected endocrine disorder: Secondary | ICD-10-CM

## 2014-01-03 DIAGNOSIS — Z131 Encounter for screening for diabetes mellitus: Secondary | ICD-10-CM

## 2014-01-03 MED ORDER — TAVABOROLE 5 % EX SOLN: 1.0000 "application " | Freq: Every day | CUTANEOUS | Status: DC

## 2014-01-03 NOTE — Progress Notes (Signed)
IDENTIFYING INFORMATION  Corey Garrett / DOB: 1965-09-01 / MRN: [id number]  The patient has GOUT; ANXIETY; DEPRESSION; COLONIC POLYPS, HX OF; HEMORRHOIDS, HX OF; Preventative health care; Insomnia; Hypersomnolence; Hyperuricemia; Basal cell carcinoma; Alcohol use disorder, mild, in early remission; and Smoker on his problem list.  SUBJECTIVE  Chief Complaint: Annual Exam   History of present illness: Corey Garrett is a 48 y.o. year old male who presents for an annual physical.   He has had three colonoscopies in 2005, 2010, and 2015.  On the last colonoscopy two sessile polyps were found and obtained, and along with some mild internal hemorrhoids. No dysplasia or malignancy was found.  He does not have a formal exercise program.  He consumes roughly 2 drinks a day.  He does not take an aspirin a day.  His BP is well controlled.  He reports smoking cigars roughly twice monthly but does not inhale.    He denies depression, and denies anhedonia.  He has significant family stress as he has a special needs daughter.    He complains of thickened of a thickened great toe nail on the left foot.  The toe nail does not cause him any pain or symptoms, but just look bad.  He would like to have treatment for this today.   He denies a history of liver disease.    He is unsure if there is a history of prostate cancer in his family.       He  has a past medical history of ANXIETY (10/07/2006); COLONIC POLYPS, HX OF (07/10/2007); DEPRESSION (12/29/2008); GOUT (07/10/2007); HEMORRHOIDS, HX OF (10/07/2006); Hyperuricemia (04/03/2013); Basal cell carcinoma; and Skin tag of anus.  The patient has a current medication list which includes the following prescription(s): fenofibrate, indomethacin, multivitamin, sertraline, indomethacin, and tavaborole.  Corey Garrett has No Known Allergies. He  reports that he has quit smoking. His smoking use included Cigars. He has never used smokeless tobacco. He reports that he  drinks about 2.0 - 3.0 oz of alcohol per week. He reports that he does not use illicit drugs. He  has no sexual activity history on file.  The patient  has no past surgical history on file.  His family history includes Anxiety disorder in his other; Autism in his daughter; Emphysema in his father.  Review of Systems  Constitutional: Negative.   HENT: Negative.   Eyes: Negative.   Respiratory: Negative.   Cardiovascular: Negative.   Gastrointestinal: Negative.   Genitourinary: Negative.   Musculoskeletal: Negative.   Skin: Negative.   Endo/Heme/Allergies: Negative.     OBJECTIVE  Blood pressure 128/82, pulse 75, temperature 98.3 F (36.8 C), temperature source Oral, resp. rate 16, height 6' 1.5" (1.867 m), weight 277 lb (125.646 kg), SpO2 97.00%. The patient's body mass index is 36.05 kg/(m^2).  Physical Exam  Constitutional: He is oriented to person, place, and time and well-developed, well-nourished, and in no distress.  HENT:  Head: Normocephalic.  Right Ear: External ear normal.  Left Ear: External ear normal.  Eyes: Conjunctivae and EOM are normal. Pupils are equal, round, and reactive to light.  Neck: Normal range of motion. Neck supple. No JVD present. No tracheal deviation present. No thyromegaly present.  Cardiovascular: Normal rate, regular rhythm and normal heart sounds.   No murmur heard. Pulmonary/Chest: Effort normal and breath sounds normal.  Abdominal: Soft. Bowel sounds are normal.  Musculoskeletal: Normal range of motion.  Lymphadenopathy:    He has no cervical adenopathy.  Neurological:  He is alert and oriented to person, place, and time. Gait normal.  Skin: Skin is warm and dry.     Psychiatric: Mood, memory, affect and judgment normal.    No results found for this or any previous visit (from the past 24 hour(s)).  ASSESSMENT & PLAN  Corey Garrett was seen today for annual exam.  Diagnoses and associated orders for this visit:  Annual physical  exam - Comprehensive metabolic panel; Future - CBC with Differential; Future - POCT urinalysis dipstick; Future - POCT UA - Microscopic Only; Future  Screening for diabetes mellitus - Comprehensive metabolic panel; Future - Hemoglobin A1c; Future - Hepatitis B surface antigen; Future - Hepatitis B surface antibody; Future  Screening cholesterol level - Lipid panel; Future  Need for hepatitis B screening test  Need for hepatitis C screening test - Hepatitis C antibody; Future  Screening for thyroid disorder - TSH; Future  Screening for HIV (human immunodeficiency virus) - HIV antibody; Future  Routine screening for STI (sexually transmitted infection) - RPR; Future  Toenail fungus - Tavaborole (KERYDIN) 5 % SOLN; Apply 1 application topically daily.     The patient was instructed to to call or comeback to clinic as needed, or should symptoms warrant.  Philis Fendt, MHS, PA-C [facility] 01/05/2014 12:47 PM

## 2014-01-03 NOTE — Progress Notes (Signed)
I was directly involved with the patient's care and agree with the physical, diagnosis and treatment plan.  

## 2014-01-04 ENCOUNTER — Other Ambulatory Visit (INDEPENDENT_AMBULATORY_CARE_PROVIDER_SITE_OTHER): Payer: 59 | Admitting: Family Medicine

## 2014-01-04 DIAGNOSIS — Z113 Encounter for screening for infections with a predominantly sexual mode of transmission: Secondary | ICD-10-CM

## 2014-01-04 DIAGNOSIS — Z114 Encounter for screening for human immunodeficiency virus [HIV]: Secondary | ICD-10-CM

## 2014-01-04 DIAGNOSIS — Z13220 Encounter for screening for lipoid disorders: Secondary | ICD-10-CM

## 2014-01-04 DIAGNOSIS — Z131 Encounter for screening for diabetes mellitus: Secondary | ICD-10-CM

## 2014-01-04 DIAGNOSIS — Z1159 Encounter for screening for other viral diseases: Secondary | ICD-10-CM

## 2014-01-04 DIAGNOSIS — Z0000 Encounter for general adult medical examination without abnormal findings: Secondary | ICD-10-CM

## 2014-01-04 DIAGNOSIS — Z1329 Encounter for screening for other suspected endocrine disorder: Secondary | ICD-10-CM

## 2014-01-04 LAB — POCT URINALYSIS DIPSTICK
BILIRUBIN UA: NEGATIVE
Glucose, UA: NEGATIVE
KETONES UA: NEGATIVE
Nitrite, UA: NEGATIVE
PH UA: 5
Protein, UA: NEGATIVE
RBC UA: NEGATIVE
SPEC GRAV UA: 1.015
Urobilinogen, UA: 0.2

## 2014-01-04 LAB — COMPREHENSIVE METABOLIC PANEL
ALT: 20 U/L (ref 0–53)
AST: 21 U/L (ref 0–37)
Albumin: 4.4 g/dL (ref 3.5–5.2)
Alkaline Phosphatase: 78 U/L (ref 39–117)
BILIRUBIN TOTAL: 0.4 mg/dL (ref 0.2–1.2)
BUN: 18 mg/dL (ref 6–23)
CALCIUM: 9.4 mg/dL (ref 8.4–10.5)
CHLORIDE: 107 meq/L (ref 96–112)
CO2: 27 mEq/L (ref 19–32)
Creat: 0.95 mg/dL (ref 0.50–1.35)
Glucose, Bld: 95 mg/dL (ref 70–99)
Potassium: 4.6 mEq/L (ref 3.5–5.3)
Sodium: 140 mEq/L (ref 135–145)
Total Protein: 7.1 g/dL (ref 6.0–8.3)

## 2014-01-04 LAB — POCT UA - MICROSCOPIC ONLY
BACTERIA, U MICROSCOPIC: NEGATIVE
CRYSTALS, UR, HPF, POC: NEGATIVE
Casts, Ur, LPF, POC: NEGATIVE
EPITHELIAL CELLS, URINE PER MICROSCOPY: NEGATIVE
Mucus, UA: NEGATIVE
RBC, urine, microscopic: NEGATIVE
WBC, Ur, HPF, POC: NEGATIVE
Yeast, UA: NEGATIVE

## 2014-01-04 LAB — HIV ANTIBODY (ROUTINE TESTING W REFLEX): HIV 1&2 Ab, 4th Generation: NONREACTIVE

## 2014-01-04 LAB — CBC WITH DIFFERENTIAL/PLATELET
BASOS ABS: 0 10*3/uL (ref 0.0–0.1)
BASOS PCT: 0 % (ref 0–1)
EOS ABS: 0.1 10*3/uL (ref 0.0–0.7)
EOS PCT: 2 % (ref 0–5)
HEMATOCRIT: 42.3 % (ref 39.0–52.0)
Hemoglobin: 14.3 g/dL (ref 13.0–17.0)
Lymphocytes Relative: 20 % (ref 12–46)
Lymphs Abs: 1.5 10*3/uL (ref 0.7–4.0)
MCH: 27.8 pg (ref 26.0–34.0)
MCHC: 33.8 g/dL (ref 30.0–36.0)
MCV: 82.3 fL (ref 78.0–100.0)
MONO ABS: 0.3 10*3/uL (ref 0.1–1.0)
Monocytes Relative: 4 % (ref 3–12)
Neutro Abs: 5.4 10*3/uL (ref 1.7–7.7)
Neutrophils Relative %: 74 % (ref 43–77)
Platelets: 189 10*3/uL (ref 150–400)
RBC: 5.14 MIL/uL (ref 4.22–5.81)
RDW: 14.9 % (ref 11.5–15.5)
WBC: 7.3 10*3/uL (ref 4.0–10.5)

## 2014-01-04 LAB — LIPID PANEL
CHOL/HDL RATIO: 4.9 ratio
Cholesterol: 147 mg/dL (ref 0–200)
HDL: 30 mg/dL — AB (ref >39)
LDL Cholesterol: 82 mg/dL (ref 0–99)
Triglycerides: 175 mg/dL — ABNORMAL HIGH (ref <150)
VLDL: 35 mg/dL (ref 0–40)

## 2014-01-04 LAB — HEMOGLOBIN A1C
Hgb A1c MFr Bld: 5.5 % (ref <5.7)
Mean Plasma Glucose: 111 mg/dL (ref <117)

## 2014-01-04 LAB — HEPATITIS C ANTIBODY: HCV AB: NEGATIVE

## 2014-01-04 LAB — HEPATITIS B SURFACE ANTIBODY, QUANTITATIVE: Hepatitis B-Post: 0 m[IU]/mL

## 2014-01-04 LAB — RPR

## 2014-01-04 LAB — TSH: TSH: 0.796 u[IU]/mL (ref 0.350–4.500)

## 2014-01-04 LAB — HEPATITIS B SURFACE ANTIGEN: Hepatitis B Surface Ag: NEGATIVE

## 2014-01-05 NOTE — Progress Notes (Signed)
Quick Note:  All labs WNL aside from lipid panel, which appears to be improving with the addition of fenofibrate. Exercise via walking 20 mins each day was discussed in the offcie, and could yield further improvement in this panel. ______

## 2014-09-10 ENCOUNTER — Ambulatory Visit: Payer: Self-pay | Admitting: Internal Medicine

## 2014-09-11 ENCOUNTER — Encounter: Payer: Self-pay | Admitting: Internal Medicine

## 2014-09-11 ENCOUNTER — Ambulatory Visit (INDEPENDENT_AMBULATORY_CARE_PROVIDER_SITE_OTHER)
Admission: RE | Admit: 2014-09-11 | Discharge: 2014-09-11 | Disposition: A | Discharge status: Home / Self Care | Payer: 59 | Source: Ambulatory Visit | Attending: Internal Medicine | Admitting: Internal Medicine

## 2014-09-11 ENCOUNTER — Ambulatory Visit (INDEPENDENT_AMBULATORY_CARE_PROVIDER_SITE_OTHER): Payer: 59 | Admitting: Internal Medicine

## 2014-09-11 VITALS — BP 118/76 | HR 80 | Temp 98.2°F | Ht 74.0 in | Wt 290.0 lb

## 2014-09-11 DIAGNOSIS — M25562 Pain in left knee: Secondary | ICD-10-CM | POA: Diagnosis not present

## 2014-09-11 DIAGNOSIS — R0683 Snoring: Secondary | ICD-10-CM

## 2014-09-11 DIAGNOSIS — R05 Cough: Secondary | ICD-10-CM

## 2014-09-11 DIAGNOSIS — R059 Cough, unspecified: Secondary | ICD-10-CM

## 2014-09-11 MED ORDER — PREDNISONE 20 MG PO TABS: 20.0000 mg | ORAL_TABLET | Freq: Every day | ORAL | Status: DC

## 2014-09-11 MED ORDER — ALBUTEROL SULFATE HFA 108 (90 BASE) MCG/ACT IN AERS: 2.0000 | INHALATION_SPRAY | Freq: Four times a day (QID) | RESPIRATORY_TRACT | Status: DC | PRN

## 2014-09-11 MED ORDER — ALBUTEROL SULFATE HFA 108 (90 BASE) MCG/ACT IN AERS
2.0000 | INHALATION_SPRAY | Freq: Four times a day (QID) | RESPIRATORY_TRACT | Status: DC | PRN
Start: 2014-09-11 — End: 2016-11-11

## 2014-09-11 MED ORDER — TRIAMCINOLONE ACETONIDE 55 MCG/ACT NA AERO: 2.0000 | INHALATION_SPRAY | Freq: Every day | NASAL | Status: DC

## 2014-09-11 NOTE — Assessment & Plan Note (Signed)
?   Small cartilage tear - pt to make appt with Dr Smith/sport [facility]

## 2014-09-11 NOTE — Patient Instructions (Addendum)
Please take all new medication as prescribed  - the nasacort for any nasal congestion and snoring, as well as the prednisone to clear up any allergies or asthma temporarily as a trial, and the inhaler if needed  Please continue all other medications as before, and refills have been done if requested.  Please have the pharmacy call with any other refills you may need.  Please continue your efforts at being more active, low cholesterol diet, and weight control.  Please make appt with Dr Tamala Julian for the left knee pain as you leave today  Please keep your appointments with your specialists as you may have planned  Please go to the XRAY Department in the Basement (go straight as you get off the elevator) for the x-ray testing  You will be contacted by phone if any changes need to be made immediately.  Otherwise, you will receive a letter about your results with an explanation, but please check with MyChart first.

## 2014-09-11 NOTE — Assessment & Plan Note (Signed)
No daytime somnolence, for nasacort trial, consider ENT if not improved

## 2014-09-11 NOTE — Progress Notes (Signed)
Subjective:    Patient ID: Corey Garrett, male    DOB: 03-15-1965, 49 y.o.   MRN: [id number]  HPI  Here with 2.5 mo onset non prod cough, does have snoring at night and mild nasal congestion but not clear about post nasal gtt to the pt; does also has some unusual sob and wheezy type sounds at the end of longer sentences with talking but o/w Pt denies chest pain, increased sob or doe, wheezing, orthopnea, PND, increased LE swelling, palpitations, dizziness or syncope.  Pt denies new neurological symptoms such as new headache, or facial or extremity weakness or numbness   Pt denies polydipsia, polyuria.  No hx of allergy or asthma per pt, symptoms actually some better in the past wk.  No fever, sick contacts, recent wt gain, daytime somnolence, travel outside the [place], unsual pets or work exposures.  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  Denies worsening reflux, abd pain, dysphagia, n/v, bowel change or blood.  Also c/o snoring at night but daytime somnolence.  Also c/o left knee pain./swelling x 1 wk after a twisting of the knee with turning quickly, mild and ambulates ok but little to no improvement over the wk, pain worse to stand, better to sit, no other hx of trauma, gout, fever or prior knee issue. Past Medical History  Diagnosis Date  . ANXIETY 10/07/2006  . COLONIC POLYPS, HX OF 07/10/2007  . DEPRESSION 12/29/2008  . GOUT 07/10/2007  . HEMORRHOIDS, HX OF 10/07/2006  . Hyperuricemia 04/03/2013  . Basal cell carcinoma     right shoulder blade  . Skin tag of anus    No past surgical history on file.  reports that he has quit smoking. His smoking use included Cigars. He has never used smokeless tobacco. He reports that he drinks about 2.0 - 3.0 oz of alcohol per week. He reports that he does not use illicit drugs. family history includes Anxiety disorder in his other; Autism in his daughter; Emphysema in his  father. No Known Allergies Current Outpatient Prescriptions on File Prior to Visit  Medication Sig Dispense Refill  . indomethacin (INDOCIN) 50 MG capsule prn    . Multiple Vitamin (MULTIVITAMIN) tablet Take 1 tablet by mouth daily.    . fenofibrate 160 MG tablet Take 1 tablet (160 mg total) by mouth daily. (Patient not taking: Reported on 09/11/2014) 90 tablet 2  . indomethacin (INDOCIN) 50 MG capsule Take 1 capsule by mouth 3  times a day with meals (Patient not taking: Reported on 09/11/2014) 270 capsule 2  . sertraline (ZOLOFT) 100 MG tablet Take 2 tablets by mouth  every day (Patient not taking: Reported on 09/11/2014) 180 tablet 0  . Tavaborole (KERYDIN) 5 % SOLN Apply 1 application topically daily. (Patient not taking: Reported on 09/11/2014) 10 mL 0   No current facility-administered medications on file prior to visit.   Review of Systems  Constitutional: Negative for unusual diaphoresis or night sweats HENT: Negative for ringing in ear or discharge Eyes: Negative for double vision or worsening visual disturbance.  Respiratory: Negative for choking and stridor.   Gastrointestinal: Negative for vomiting or other signifcant bowel change Genitourinary: Negative for hematuria or change in urine volume.  Musculoskeletal: Negative for other MSK pain or swelling Skin: Negative for color change and worsening wound.  Neurological: Negative for tremors and numbness other than noted  Psychiatric/Behavioral: Negative for decreased  concentration or agitation other than above       Objective:   Physical Exam BP 118/76 mmHg  Pulse 80  Temp(Src) 98.2 F (36.8 C) (Oral)  Ht 6\' 2"  (1.88 m)  Wt 290 lb (131.543 kg)  BMI 37.22 kg/m2  SpO2 95% VS noted,  Constitutional: Pt appears in no significant distress HENT: Head: NCAT.  Right Ear: External ear normal.  Left Ear: External ear normal.  Eyes: . Pupils are equal, round, and reactive to light. Conjunctivae and EOM are normal Neck: Normal range  of motion. Neck supple.  Cardiovascular: Normal rate and regular rhythm.   Pulmonary/Chest: Effort normal and breath sounds somewhat decreased without rales or wheezing.  Neurological: Pt is alert. Not confused , motor grossly intact Skin: Skin is warm. No rash, no LE edema Psychiatric: Pt behavior is normal. No agitation.  Left knee with 1+ effusion, FROM, mild tender medial joint line, neg lochmans, no crepitus appreciated    Assessment & Plan:

## 2014-09-11 NOTE — Assessment & Plan Note (Signed)
Etiology unclear, but suspect mild persistent new seasonal allergy and/or asthma related , for trial prednisone 20 qd x 7 days, proair hfa prn, check cxr , consider pulm referral, consider add PPI but he declines at this time, work on wt loss/excercise

## 2014-09-11 NOTE — Progress Notes (Signed)
Pre visit review using our clinic review tool, if applicable. No additional management support is needed unless otherwise documented below in the visit note. 

## 2014-09-30 ENCOUNTER — Ambulatory Visit (INDEPENDENT_AMBULATORY_CARE_PROVIDER_SITE_OTHER): Payer: 59 | Admitting: Family Medicine

## 2014-09-30 ENCOUNTER — Other Ambulatory Visit (INDEPENDENT_AMBULATORY_CARE_PROVIDER_SITE_OTHER): Payer: 59

## 2014-09-30 ENCOUNTER — Encounter: Payer: Self-pay | Admitting: Family Medicine

## 2014-09-30 ENCOUNTER — Ambulatory Visit (INDEPENDENT_AMBULATORY_CARE_PROVIDER_SITE_OTHER)
Admission: RE | Admit: 2014-09-30 | Discharge: 2014-09-30 | Disposition: A | Discharge status: Home / Self Care | Payer: 59 | Source: Ambulatory Visit | Attending: Family Medicine | Admitting: Family Medicine

## 2014-09-30 VITALS — BP 116/74 | HR 77 | Ht 74.0 in | Wt 287.0 lb

## 2014-09-30 DIAGNOSIS — S83282A Other tear of lateral meniscus, current injury, left knee, initial encounter: Secondary | ICD-10-CM

## 2014-09-30 DIAGNOSIS — M25562 Pain in left knee: Secondary | ICD-10-CM

## 2014-09-30 DIAGNOSIS — S83289A Other tear of lateral meniscus, current injury, unspecified knee, initial encounter: Secondary | ICD-10-CM | POA: Insufficient documentation

## 2014-09-30 NOTE — Patient Instructions (Addendum)
Good to see you.  Ice 20 minutes 2 times daily. Usually after activity and before bed. Exercises 3 times a week.  Xrays downstairs today Consider wearing brace pennsaid pinkie amount topically 2 times daily as needed.  Tart cherry extract nightly can help pain and the gout Turmeric 500mg  twice daily Tylenol 650mg  3 times daily See me again in 3 weeks.

## 2014-09-30 NOTE — Progress Notes (Signed)
[facility] [place], [place] [address] Phone: [phone] Subjective:    I'm seeing this patient by the request  of:  Corey Cower, MD   CC: Left knee pain  HAL:[id number] Corey Garrett is a 49 y.o. male coming in with complaint of left knee pain. Patient has had this pain for 2 months. Does not render Ranger injury. States that he had significant swelling initially and then it seemed to resolve on its own. States though that the knee continues to be more swollen than his contralateral side. An states that when sitting a long amount time and getting up it causes significant pain or if he does a twisting motion. Rates the severity of pain when it occurs a 7 out of 10 and patient states he has fallen twice secondary to the pain. States that most regular daily activities only does not have any significant discomfort. Patient though is unable to do certain things such as playing golf secondary to the pain. Has tried some over-the-counter anti-inflammatory's with minimal benefit.  Past Medical History  Diagnosis Date  . ANXIETY 10/07/2006  . COLONIC POLYPS, HX OF 07/10/2007  . DEPRESSION 12/29/2008  . GOUT 07/10/2007  . HEMORRHOIDS, HX OF 10/07/2006  . Hyperuricemia 04/03/2013  . Basal cell carcinoma     right shoulder blade  . Skin tag of anus    No past surgical history on file. History  Substance Use Topics  . Smoking status: Former Smoker    Types: Cigars  . Smokeless tobacco: Never Used     Comment: Occasional cigar.  . Alcohol Use: 2.0 - 3.0 oz/week    4-6 drink(s) per week   No Known Allergies Family History  Problem Relation Age of Onset  . Autism Daughter     epilepsy  . Anxiety disorder Other   . Emphysema Father         Past medical history, social, surgical and family history all reviewed in electronic medical record.   Review of Systems: No headache, visual changes, nausea, vomiting, diarrhea, constipation, dizziness,  abdominal pain, skin rash, fevers, chills, night sweats, weight loss, swollen lymph nodes, body aches, joint swelling, muscle aches, chest pain, shortness of breath, mood changes.   Objective Blood pressure 116/74, pulse 77, height 6\' 2"  (1.88 m), weight 287 lb (130.182 kg), SpO2 94 %.  General: No apparent distress alert and oriented x3 mood and affect normal, dressed appropriately.  HEENT: Pupils equal, extraocular movements intact  Respiratory: Patient's speak in full sentences and does not appear short of breath  Cardiovascular: No lower extremity edema, non tender, no erythema  Skin: Warm dry intact with no signs of infection or rash on extremities or on axial skeleton.  Abdomen: Soft nontender  Neuro: Cranial nerves II through XII are intact, neurovascularly intact in all extremities with 2+ DTRs and 2+ pulses.  Lymph: No lymphadenopathy of posterior or anterior cervical chain or axillae bilaterally.  Gait normal with good balance and coordination.  MSK:  Non tender with full range of motion and good stability and symmetric strength and tone of shoulders, elbows, wrist, hip, and ankles bilaterally.  Knee: Left Normal to inspection with no erythema or effusion or obvious bony abnormalities. Palpation normal with no warmth, joint line tenderness, patellar tenderness, or condyle tenderness. ROM full in flexion and extension and lower leg rotation. Ligaments with solid consistent endpoints including ACL, PCL, LCL, MCL. Negative Mcmurray's, Apley's, and Thessalonian tests. Non painful patellar compression.  Patellar glide without crepitus. Patellar and quadriceps tendons unremarkable. Hamstring and quadriceps strength is normal.    MSK US performed of: Left knee This study was ordered, performed, and interpreted by Charlann Boxer D.O.  Knee: All structures visualized. Effusion noted Anterior lateral to posterior lateral meniscus does have a large tear that seems to be more of a  intersubstance. No displacement.  Patellar Tendon unremarkable on long and transverse views without effusion. No abnormality of prepatellar bursa. LCL and MCL unremarkable on long and transverse views. No abnormality of origin of medial or lateral head of the gastrocnemius.  IMPRESSION:  Lateral meniscal tear proximal only 40% of meniscus but and oh displacement  Procedure: Real-time Ultrasound Guided Injection of left knee Device: GE Logiq E  Ultrasound guided injection is preferred based studies that show increased duration, increased effect, greater accuracy, decreased procedural pain, increased response rate, and decreased cost with ultrasound guided versus blind injection.  Verbal informed consent obtained.  Time-out conducted.  Noted no overlying erythema, induration, or other signs of local infection.  Skin prepped in a sterile fashion.  Local anesthesia: Topical Ethyl chloride.  With sterile technique and under real time ultrasound guidance: With a 22-gauge 2 inch needle patient was injected with 4 cc of 0.5% Marcaine and aspirated 2 mL of strawlike fluid then injected 1 cc of Kenalog 40 mg/dL. This was from a superior lateral approach.  Completed without difficulty  Pain immediately resolved suggesting accurate placement of the medication.  Advised to call if fevers/chills, erythema, induration, drainage, or persistent bleeding.  Images permanently stored and available for review in the ultrasound unit.  Impression: Technically successful ultrasound guided injection.    Impression and Recommendations:     This case required medical decision making of moderate complexity.

## 2014-09-30 NOTE — Progress Notes (Signed)
Pre visit review using our clinic review tool, if applicable. No additional management support is needed unless otherwise documented below in the visit note. 

## 2014-09-30 NOTE — Assessment & Plan Note (Signed)
She was given an injection today. Patient was given a brace. We discussed home exercises icing protocol and patient was given topical anti-inflammatory's. Patient also given oral anti-inflammatory's to try. We discussed over-the-counter natural supplementations. Patient will try to make these different changes and come back again in 3 weeks. X-rays ordered today to rule out any other bony abnormality that can be contribute.

## 2014-10-23 ENCOUNTER — Ambulatory Visit (INDEPENDENT_AMBULATORY_CARE_PROVIDER_SITE_OTHER): Payer: 59 | Admitting: Family Medicine

## 2014-10-23 ENCOUNTER — Other Ambulatory Visit (INDEPENDENT_AMBULATORY_CARE_PROVIDER_SITE_OTHER): Payer: 59

## 2014-10-23 ENCOUNTER — Other Ambulatory Visit: Payer: 59

## 2014-10-23 ENCOUNTER — Encounter: Payer: Self-pay | Admitting: Family Medicine

## 2014-10-23 ENCOUNTER — Ambulatory Visit: Payer: 59 | Admitting: Internal Medicine

## 2014-10-23 VITALS — BP 116/80 | HR 72 | Wt 282.0 lb

## 2014-10-23 DIAGNOSIS — M25562 Pain in left knee: Secondary | ICD-10-CM | POA: Diagnosis not present

## 2014-10-23 MED ORDER — MELOXICAM 15 MG PO TABS: 15.0000 mg | ORAL_TABLET | Freq: Every day | ORAL | Status: DC

## 2014-10-23 NOTE — Progress Notes (Signed)
[facility] [place], [place] [address] Phone: [phone] Subjective:    CC: Left knee pain follow-up  IDP:[id number] VOSHON Garrett is a 49 y.o. male coming in with complaint of left knee pain. She was found to have a meniscal tear previously as well as a knee effusion. Patient did have an aspiration and injection of Kenalog previously. Patient was doing significant a better than this last week patient started having increasing pain and swelling. Patient was on the lateral and doing a lot of painting as well as squatting. States that the seem to make it worse. Continue to wear the brace. Denies though any locking or giving out on him. States that he is still having a dull aching sensation now starting again at night.  Past Medical History  Diagnosis Date  . ANXIETY 10/07/2006  . COLONIC POLYPS, HX OF 07/10/2007  . DEPRESSION 12/29/2008  . GOUT 07/10/2007  . HEMORRHOIDS, HX OF 10/07/2006  . Hyperuricemia 04/03/2013  . Basal cell carcinoma     right shoulder blade  . Skin tag of anus    History reviewed. No pertinent past surgical history. Social History  Substance Use Topics  . Smoking status: Former Smoker    Types: Cigars  . Smokeless tobacco: Never Used     Comment: Occasional cigar.  . Alcohol Use: 2.0 - 3.0 oz/week    4-6 drink(s) per week   No Known Allergies Family History  Problem Relation Age of Onset  . Autism Daughter     epilepsy  . Anxiety disorder Other   . Emphysema Father         Past medical history, social, surgical and family history all reviewed in electronic medical record.   Review of Systems: No headache, visual changes, nausea, vomiting, diarrhea, constipation, dizziness, abdominal pain, skin rash, fevers, chills, night sweats, weight loss, swollen lymph nodes, body aches, joint swelling, muscle aches, chest pain, shortness of breath, mood changes.   Objective Blood pressure 116/80, pulse 72, weight 282 lb  (127.914 kg).  General: No apparent distress alert and oriented x3 mood and affect normal, dressed appropriately.  HEENT: Pupils equal, extraocular movements intact  Respiratory: Patient's speak in full sentences and does not appear short of breath  Cardiovascular: No lower extremity edema, non tender, no erythema  Skin: Warm dry intact with no signs of infection or rash on extremities or on axial skeleton.  Abdomen: Soft nontender  Neuro: Cranial nerves II through XII are intact, neurovascularly intact in all extremities with 2+ DTRs and 2+ pulses.  Lymph: No lymphadenopathy of posterior or anterior cervical chain or axillae bilaterally.  Gait normal with good balance and coordination.  MSK:  Non tender with full range of motion and good stability and symmetric strength and tone of shoulders, elbows, wrist, hip, and ankles bilaterally.  Knee: Left Significant effusion noted Tender over the posterior medial and posterior lateral joint lines. ROM full in flexion and extension and lower leg rotation. Ligaments with solid consistent endpoints including ACL, PCL, LCL, MCL. Positive Mcmurray's, Apley's, and Thessalonian tests. Non painful patellar compression. Patellar glide without crepitus. Patellar and quadriceps tendons unremarkable. Hamstring and quadriceps strength is normal.    MSK US performed of: Left knee This study was ordered, performed, and interpreted by Charlann Boxer D.O.  Knee: All structures visualized. Effusion noted in larger than last time Anterior lateral to posterior lateral meniscus does have a large tear that seems to be more of a  intersubstance. No displacement. No significant change from previous exam Patellar Tendon unremarkable on long and transverse views without effusion. No abnormality of prepatellar bursa. LCL and MCL unremarkable on long and transverse views. No abnormality of origin of medial or lateral head of the gastrocnemius.  IMPRESSION: Continued  lateral meniscal tear  Procedure: Real-time Ultrasound Guided aspiration of left knee Device: GE Logiq E  Ultrasound guided injection is preferred based studies that show increased duration, increased effect, greater accuracy, decreased procedural pain, increased response rate, and decreased cost with ultrasound guided versus blind injection.  Verbal informed consent obtained.  Time-out conducted.  Noted no overlying erythema, induration, or other signs of local infection.  Skin prepped in a sterile fashion.  Local anesthesia: Topical Ethyl chloride.  With sterile technique and under real time ultrasound guidance: With a 21-gauge 2 inch needle patient was injected with 2 mL of 0.5% Marcaine and then had aspiration of 70 mL of yellow colored fluid with no signs of infection.  Completed without difficulty  Pain immediately resolved suggesting accurate placement of the medication.  Advised to call if fevers/chills, erythema, induration, drainage, or persistent bleeding.  Images permanently stored and available for review in the ultrasound unit.  Impression: Technically successful ultrasound guided injection.    Impression and Recommendations:     This case required medical decision making of moderate complexity.

## 2014-10-23 NOTE — Patient Instructions (Addendum)
Good to see you again Keep icing your knee a few times a day If the swelling comes back within a week, call us and we will order an MRI  Start the Meloxicam for 10days, 1 pill one day then as needed Keep wearing the brace Otherwise see me again in 2-3 weeks and we will make sure you are doing good.

## 2014-10-23 NOTE — Assessment & Plan Note (Signed)
Drain patient's effusion again. We are going to run the fluid for the potential gout. Patient does have indomethacin and has not had a gout flare in quite some time. We may need to consider putting him on medication if this does show. X-rays previously showed mild arthritis. Discussed with patient to have a recurrent effusion there is likely an intra-articular pathology that is not being warranted. Patient does have lateral meniscal tear that could be contribute in. Patient continues to have effusion we may need to consider further imaging. Patient will continue with the bracing and follow-up again in 2-3 weeks.

## 2014-10-23 NOTE — Progress Notes (Signed)
Pre visit review using our clinic review tool, if applicable. No additional management support is needed unless otherwise documented below in the visit note. 

## 2014-10-24 ENCOUNTER — Telehealth: Payer: Self-pay | Admitting: Family Medicine

## 2014-10-24 LAB — SYNOVIAL CELL COUNT + DIFF, W/ CRYSTALS
CRYSTALS FLUID: NONE SEEN
Eosinophils-Synovial: 0 % (ref 0–1)
LYMPHOCYTES-SYNOVIAL FLD: 10 % (ref 0–20)
Monocyte/Macrophage: 10 % — ABNORMAL LOW (ref 50–90)
Neutrophil, Synovial: 80 % — ABNORMAL HIGH (ref 0–25)
WBC, Synovial: 2010 cu mm — ABNORMAL HIGH (ref 0–200)

## 2014-10-24 MED ORDER — DOXYCYCLINE HYCLATE 100 MG PO TABS: 100.0000 mg | ORAL_TABLET | Freq: Two times a day (BID) | ORAL | Status: AC

## 2014-10-24 NOTE — Telephone Encounter (Signed)
Called patient about lab results Release in my chart Will treat for possible infection.  OCnitnue plan otherwise.

## 2014-11-05 ENCOUNTER — Ambulatory Visit: Payer: 59 | Admitting: Family Medicine

## 2014-12-02 ENCOUNTER — Encounter: Payer: Self-pay | Admitting: Family Medicine

## 2014-12-02 DIAGNOSIS — M25562 Pain in left knee: Secondary | ICD-10-CM

## 2014-12-14 ENCOUNTER — Encounter: Payer: Self-pay | Admitting: Family Medicine

## 2014-12-16 ENCOUNTER — Other Ambulatory Visit: Payer: Self-pay | Admitting: Family Medicine

## 2014-12-16 DIAGNOSIS — M25562 Pain in left knee: Secondary | ICD-10-CM

## 2014-12-23 ENCOUNTER — Other Ambulatory Visit (INDEPENDENT_AMBULATORY_CARE_PROVIDER_SITE_OTHER): Payer: 59

## 2014-12-23 ENCOUNTER — Ambulatory Visit (INDEPENDENT_AMBULATORY_CARE_PROVIDER_SITE_OTHER): Payer: 59 | Admitting: Internal Medicine

## 2014-12-23 ENCOUNTER — Encounter: Payer: Self-pay | Admitting: Internal Medicine

## 2014-12-23 ENCOUNTER — Ambulatory Visit
Admission: RE | Admit: 2014-12-23 | Discharge: 2014-12-23 | Disposition: A | Discharge status: Home / Self Care | Payer: 59 | Source: Ambulatory Visit | Attending: Family Medicine | Admitting: Family Medicine

## 2014-12-23 VITALS — BP 116/84 | HR 80 | Temp 99.2°F | Ht 74.0 in | Wt 281.0 lb

## 2014-12-23 DIAGNOSIS — R7989 Other specified abnormal findings of blood chemistry: Secondary | ICD-10-CM

## 2014-12-23 DIAGNOSIS — Z0000 Encounter for general adult medical examination without abnormal findings: Secondary | ICD-10-CM

## 2014-12-23 DIAGNOSIS — M25562 Pain in left knee: Secondary | ICD-10-CM

## 2014-12-23 LAB — URINALYSIS, ROUTINE W REFLEX MICROSCOPIC
Bilirubin Urine: NEGATIVE
Hgb urine dipstick: NEGATIVE
KETONES UR: NEGATIVE
Nitrite: NEGATIVE
RBC / HPF: NONE SEEN (ref 0–?)
SPECIFIC GRAVITY, URINE: 1.02 (ref 1.000–1.030)
Total Protein, Urine: NEGATIVE
Urine Glucose: NEGATIVE
Urobilinogen, UA: 0.2 (ref 0.0–1.0)
pH: 6 (ref 5.0–8.0)

## 2014-12-23 LAB — LIPID PANEL
CHOLESTEROL: 155 mg/dL (ref 0–200)
HDL: 28.4 mg/dL — ABNORMAL LOW (ref >39.00)
Total CHOL/HDL Ratio: 5
Triglycerides: 402 mg/dL — ABNORMAL HIGH (ref 0.0–149.0)

## 2014-12-23 LAB — HEPATIC FUNCTION PANEL
ALBUMIN: 4.6 g/dL (ref 3.5–5.2)
ALK PHOS: 97 U/L (ref 39–117)
ALT: 26 U/L (ref 0–53)
AST: 21 U/L (ref 0–37)
Bilirubin, Direct: 0.1 mg/dL (ref 0.0–0.3)
TOTAL PROTEIN: 7.6 g/dL (ref 6.0–8.3)
Total Bilirubin: 0.7 mg/dL (ref 0.2–1.2)

## 2014-12-23 LAB — PSA: PSA: 0.35 ng/mL (ref 0.10–4.00)

## 2014-12-23 LAB — CBC WITH DIFFERENTIAL/PLATELET
BASOS ABS: 0 10*3/uL (ref 0.0–0.1)
Basophils Relative: 0.4 % (ref 0.0–3.0)
EOS ABS: 0.1 10*3/uL (ref 0.0–0.7)
Eosinophils Relative: 1.2 % (ref 0.0–5.0)
HCT: 48 % (ref 39.0–52.0)
Hemoglobin: 16.2 g/dL (ref 13.0–17.0)
LYMPHS ABS: 1.8 10*3/uL (ref 0.7–4.0)
Lymphocytes Relative: 16.1 % (ref 12.0–46.0)
MCHC: 33.7 g/dL (ref 30.0–36.0)
MCV: 82.1 fl (ref 78.0–100.0)
MONOS PCT: 4.8 % (ref 3.0–12.0)
Monocytes Absolute: 0.5 10*3/uL (ref 0.1–1.0)
NEUTROS ABS: 8.8 10*3/uL — AB (ref 1.4–7.7)
NEUTROS PCT: 77.5 % — AB (ref 43.0–77.0)
PLATELETS: 207 10*3/uL (ref 150.0–400.0)
RBC: 5.85 Mil/uL — AB (ref 4.22–5.81)
RDW: 14.6 % (ref 11.5–15.5)
WBC: 11.4 10*3/uL — ABNORMAL HIGH (ref 4.0–10.5)

## 2014-12-23 LAB — BASIC METABOLIC PANEL
BUN: 19 mg/dL (ref 6–23)
CALCIUM: 10.1 mg/dL (ref 8.4–10.5)
CHLORIDE: 101 meq/L (ref 96–112)
CO2: 32 meq/L (ref 19–32)
CREATININE: 1.11 mg/dL (ref 0.40–1.50)
GFR: 74.66 mL/min (ref >60.00)
GLUCOSE: 97 mg/dL (ref 70–99)
Potassium: 4.6 mEq/L (ref 3.5–5.1)
SODIUM: 140 meq/L (ref 135–145)

## 2014-12-23 LAB — TSH: TSH: 1.28 u[IU]/mL (ref 0.35–4.50)

## 2014-12-23 LAB — LDL CHOLESTEROL, DIRECT: LDL DIRECT: 68 mg/dL

## 2014-12-23 NOTE — Progress Notes (Signed)
Pre visit review using our clinic review tool, if applicable. No additional management support is needed unless otherwise documented below in the visit note. 

## 2014-12-23 NOTE — Assessment & Plan Note (Signed)

## 2014-12-23 NOTE — Patient Instructions (Addendum)
Please call if you change your mind about the flu shot  Please continue all other medications as before, and refills have been done if requested.  Please have the pharmacy call with any other refills you may need.  Please continue your efforts at being more active, low cholesterol diet, and weight control.  You are otherwise up to date with prevention measures today.  Please keep your appointments with your specialists as you may have planned - orthopedic and MRI  Please go to the LAB in the Basement (turn left off the elevator) for the tests to be done today  You will be contacted by phone if any changes need to be made immediately.  Otherwise, you will receive a letter about your results with an explanation, but please check with MyChart first.  Please remember to sign up for MyChart if you have not done so, as this will be important to you in the future with finding out test results, communicating by private email, and scheduling acute appointments online when needed.  Please return in 1 year for your yearly visit, or sooner if needed, with Lab testing done 3-5 days before

## 2014-12-23 NOTE — Progress Notes (Signed)
Subjective:    Patient ID: Corey Garrett, male    DOB: [birth year], 49 y.o.   MRN: [id number]  HPI  Here for wellness and f/u;  Overall doing ok;  Pt denies Chest pain, worsening SOB, DOE, wheezing, orthopnea, PND, worsening LE edema, palpitations, dizziness or syncope.  Pt denies neurological change such as new headache, facial or extremity weakness.  Pt denies polydipsia, polyuria, or low sugar symptoms. Pt states overall good compliance with treatment and medications, good tolerability, and has been trying to follow appropriate diet.  Pt denies worsening depressive symptoms, suicidal ideation or panic. No fever, night sweats, wt loss, loss of appetite, or other constitutional symptoms.  Pt states good ability with ADL's, has low fall risk, home safety reviewed and adequate, no other significant changes in hearing or vision, and only occasionally active with exercise, as he has a lateral meniscus tear, now for MRI and ortho eval soon Past Medical History  Diagnosis Date  . ANXIETY 10/07/2006  . COLONIC POLYPS, HX OF 07/10/2007  . DEPRESSION 12/29/2008  . GOUT 07/10/2007  . HEMORRHOIDS, HX OF 10/07/2006  . Hyperuricemia 04/03/2013  . Basal cell carcinoma     right shoulder blade  . Skin tag of anus    No past surgical history on file.  reports that he has quit smoking. His smoking use included Cigars. He has never used smokeless tobacco. He reports that he drinks about 2.0 - 3.0 oz of alcohol per week. He reports that he does not use illicit drugs. family history includes Anxiety disorder in his other; Autism in his daughter; Emphysema in his father. No Known Allergies Current Outpatient Prescriptions on File Prior to Visit  Medication Sig Dispense Refill  . indomethacin (INDOCIN) 50 MG capsule prn    . meloxicam (MOBIC) 15 MG tablet Take 1 tablet (15 mg total) by mouth daily. 30 tablet 0  . Multiple Vitamin (MULTIVITAMIN) tablet Take 1 tablet by mouth daily.    Marland Kitchen triamcinolone (NASACORT AQ) 55  MCG/ACT AERO nasal inhaler Place 2 sprays into the nose daily. 1 Inhaler 12  . albuterol (PROVENTIL HFA;VENTOLIN HFA) 108 (90 BASE) MCG/ACT inhaler Inhale 2 puffs into the lungs every 6 (six) hours as needed for wheezing or shortness of breath. (Patient not taking: Reported on 12/23/2014) 1 Inhaler 0  . fenofibrate 160 MG tablet Take 1 tablet (160 mg total) by mouth daily. (Patient not taking: Reported on 12/23/2014) 90 tablet 2  . sertraline (ZOLOFT) 100 MG tablet Take 2 tablets by mouth  every day (Patient not taking: Reported on 12/23/2014) 180 tablet 0   No current facility-administered medications on file prior to visit.    Review of Systems Constitutional: Negative for increased diaphoresis, other activity, appetite or siginficant weight change other than noted HENT: Negative for worsening hearing loss, ear pain, facial swelling, mouth sores and neck stiffness.   Eyes: Negative for other worsening pain, redness or visual disturbance.  Respiratory: Negative for shortness of breath and wheezing  Cardiovascular: Negative for chest pain and palpitations.  Gastrointestinal: Negative for diarrhea, blood in stool, abdominal distention or other pain Genitourinary: Negative for hematuria, flank pain or change in urine volume.  Musculoskeletal: Negative for myalgias or other joint complaints.  Skin: Negative for color change and wound or drainage.  Neurological: Negative for syncope and numbness. other than noted Hematological: Negative for adenopathy. or other swelling Psychiatric/Behavioral: Negative for hallucinations, SI, self-injury, decreased concentration or other worsening agitation.      Objective:  Physical Exam BP 116/84 mmHg  Pulse 80  Temp(Src) 99.2 F (37.3 C) (Oral)  Ht 6\' 2"  (1.88 m)  Wt 281 lb (127.461 kg)  BMI 36.06 kg/m2  SpO2 95% VS noted,  Constitutional: Pt is oriented to person, place, and time. Appears well-developed and well-nourished, in no significant  distress Head: Normocephalic and atraumatic.  Right Ear: External ear normal.  Left Ear: External ear normal.  Nose: Nose normal.  Mouth/Throat: Oropharynx is clear and moist.  Eyes: Conjunctivae and EOM are normal. Pupils are equal, round, and reactive to light.  Neck: Normal range of motion. Neck supple. No JVD present. No tracheal deviation present or significant neck LA or mass Cardiovascular: Normal rate, regular rhythm, normal heart sounds and intact distal pulses.   Pulmonary/Chest: Effort normal and breath sounds without rales or wheezing  Abdominal: Soft. Bowel sounds are normal. NT. No HSM  Musculoskeletal: Normal range of motion. Exhibits no edema.  Lymphadenopathy:  Has no cervical adenopathy.  Neurological: Pt is alert and oriented to person, place, and time. Pt has normal reflexes. No cranial nerve deficit. Motor grossly intact Skin: Skin is warm and dry. No rash noted.  Psychiatric:  Has normal mood and affect. Behavior is normal.     Assessment & Plan:

## 2014-12-26 ENCOUNTER — Other Ambulatory Visit: Payer: 59

## 2015-01-01 ENCOUNTER — Encounter: Payer: Self-pay | Admitting: Internal Medicine

## 2015-01-06 ENCOUNTER — Ambulatory Visit
Admission: RE | Admit: 2015-01-06 | Discharge: 2015-01-06 | Disposition: A | Discharge status: Home / Self Care | Payer: 59 | Source: Ambulatory Visit | Attending: Family Medicine | Admitting: Family Medicine

## 2015-01-06 DIAGNOSIS — M25562 Pain in left knee: Secondary | ICD-10-CM

## 2015-01-07 ENCOUNTER — Other Ambulatory Visit: Payer: Self-pay | Admitting: Family Medicine

## 2015-01-07 MED ORDER — DOXYCYCLINE HYCLATE 100 MG PO TABS: 100.0000 mg | ORAL_TABLET | Freq: Two times a day (BID) | ORAL | Status: AC

## 2015-01-08 ENCOUNTER — Encounter: Payer: Self-pay | Admitting: Family Medicine

## 2015-01-25 ENCOUNTER — Encounter: Payer: Self-pay | Admitting: Internal Medicine

## 2015-01-26 MED ORDER — FLUOXETINE HCL 20 MG PO TABS: 20.0000 mg | ORAL_TABLET | Freq: Every day | ORAL | Status: DC

## 2015-01-26 NOTE — Telephone Encounter (Signed)
rx was done erx

## 2015-02-02 ENCOUNTER — Telehealth: Payer: Self-pay | Admitting: Family Medicine

## 2015-02-02 ENCOUNTER — Other Ambulatory Visit: Payer: Self-pay

## 2015-02-02 DIAGNOSIS — M25562 Pain in left knee: Secondary | ICD-10-CM

## 2015-02-02 NOTE — Telephone Encounter (Signed)
Patient is calling to advise that he would like the referral to [facility] per your email... "We can do either way you prefer. We can go ahead & refer you to an orthopedic Surgeon. Dr Tamala Julian likes Dr. Rhona Raider or Dr. Berenice Primas at [facility] ortho for knees "

## 2015-02-02 NOTE — Telephone Encounter (Signed)
Referral entered  

## 2015-02-25 ENCOUNTER — Encounter: Payer: Self-pay | Admitting: Internal Medicine

## 2015-02-25 MED ORDER — ESZOPICLONE 2 MG PO TABS: 2.0000 mg | ORAL_TABLET | Freq: Every evening | ORAL | Status: DC | PRN

## 2015-02-25 NOTE — Telephone Encounter (Signed)
Done hardcopy to [facility]  

## 2015-03-13 ENCOUNTER — Encounter (HOSPITAL_BASED_OUTPATIENT_CLINIC_OR_DEPARTMENT_OTHER): Admission: RE | Payer: Self-pay | Source: Ambulatory Visit

## 2015-03-13 ENCOUNTER — Ambulatory Visit (HOSPITAL_BASED_OUTPATIENT_CLINIC_OR_DEPARTMENT_OTHER): Admission: RE | Admit: 2015-03-13 | Payer: 59 | Source: Ambulatory Visit | Admitting: Orthopedic Surgery

## 2015-03-13 SURGERY — ARTHROSCOPY, KNEE
Anesthesia: Choice | Site: Knee | Laterality: Left

## 2015-04-19 ENCOUNTER — Encounter: Payer: Self-pay | Admitting: Internal Medicine

## 2015-04-20 MED ORDER — INDOMETHACIN 50 MG PO CAPS
50.0000 mg | ORAL_CAPSULE | Freq: Three times a day (TID) | ORAL | Status: DC | PRN
Start: 2015-04-20 — End: 2015-10-22

## 2015-05-13 ENCOUNTER — Encounter: Payer: Self-pay | Admitting: Internal Medicine

## 2015-05-13 ENCOUNTER — Telehealth: Payer: Self-pay

## 2015-05-13 MED ORDER — FLUOXETINE HCL 20 MG PO TABS: 20.0000 mg | ORAL_TABLET | Freq: Every day | ORAL | Status: DC

## 2015-05-13 NOTE — Telephone Encounter (Signed)
Medication sent to [facility]

## 2015-07-01 ENCOUNTER — Encounter: Payer: Self-pay | Admitting: Internal Medicine

## 2015-07-01 ENCOUNTER — Other Ambulatory Visit (INDEPENDENT_AMBULATORY_CARE_PROVIDER_SITE_OTHER): Payer: 59

## 2015-07-01 ENCOUNTER — Ambulatory Visit (INDEPENDENT_AMBULATORY_CARE_PROVIDER_SITE_OTHER): Payer: 59 | Admitting: Internal Medicine

## 2015-07-01 VITALS — BP 138/80 | HR 60 | Temp 98.5°F | Resp 20 | Wt 282.0 lb

## 2015-07-01 DIAGNOSIS — R7989 Other specified abnormal findings of blood chemistry: Secondary | ICD-10-CM | POA: Diagnosis not present

## 2015-07-01 DIAGNOSIS — Z0000 Encounter for general adult medical examination without abnormal findings: Secondary | ICD-10-CM

## 2015-07-01 DIAGNOSIS — Z0001 Encounter for general adult medical examination with abnormal findings: Secondary | ICD-10-CM

## 2015-07-01 DIAGNOSIS — R6889 Other general symptoms and signs: Secondary | ICD-10-CM | POA: Diagnosis not present

## 2015-07-01 LAB — BASIC METABOLIC PANEL
BUN: 17 mg/dL (ref 6–23)
CHLORIDE: 103 meq/L (ref 96–112)
CO2: 30 meq/L (ref 19–32)
CREATININE: 0.98 mg/dL (ref 0.40–1.50)
Calcium: 10.1 mg/dL (ref 8.4–10.5)
GFR: 86.02 mL/min (ref >60.00)
GLUCOSE: 92 mg/dL (ref 70–99)
Potassium: 3.8 mEq/L (ref 3.5–5.1)
Sodium: 141 mEq/L (ref 135–145)

## 2015-07-01 LAB — URINALYSIS, ROUTINE W REFLEX MICROSCOPIC
Bilirubin Urine: NEGATIVE
Hgb urine dipstick: NEGATIVE
KETONES UR: NEGATIVE
Leukocytes, UA: NEGATIVE
NITRITE: NEGATIVE
PH: 5.5 (ref 5.0–8.0)
RBC / HPF: NONE SEEN (ref 0–?)
SPECIFIC GRAVITY, URINE: 1.02 (ref 1.000–1.030)
Total Protein, Urine: NEGATIVE
URINE GLUCOSE: NEGATIVE
Urobilinogen, UA: 0.2 (ref 0.0–1.0)

## 2015-07-01 LAB — HEPATIC FUNCTION PANEL
ALT: 22 U/L (ref 0–53)
AST: 26 U/L (ref 0–37)
Albumin: 4.8 g/dL (ref 3.5–5.2)
Alkaline Phosphatase: 95 U/L (ref 39–117)
BILIRUBIN TOTAL: 0.7 mg/dL (ref 0.2–1.2)
Bilirubin, Direct: 0.1 mg/dL (ref 0.0–0.3)
Total Protein: 7.6 g/dL (ref 6.0–8.3)

## 2015-07-01 LAB — CBC WITH DIFFERENTIAL/PLATELET
BASOS PCT: 0.4 % (ref 0.0–3.0)
Basophils Absolute: 0 10*3/uL (ref 0.0–0.1)
EOS ABS: 0.3 10*3/uL (ref 0.0–0.7)
EOS PCT: 3.1 % (ref 0.0–5.0)
HEMATOCRIT: 44 % (ref 39.0–52.0)
HEMOGLOBIN: 15.2 g/dL (ref 13.0–17.0)
LYMPHS PCT: 23.3 % (ref 12.0–46.0)
Lymphs Abs: 2.4 10*3/uL (ref 0.7–4.0)
MCHC: 34.5 g/dL (ref 30.0–36.0)
MCV: 81.6 fl (ref 78.0–100.0)
MONOS PCT: 5 % (ref 3.0–12.0)
Monocytes Absolute: 0.5 10*3/uL (ref 0.1–1.0)
Neutro Abs: 7.1 10*3/uL (ref 1.4–7.7)
Neutrophils Relative %: 68.2 % (ref 43.0–77.0)
Platelets: 206 10*3/uL (ref 150.0–400.0)
RBC: 5.39 Mil/uL (ref 4.22–5.81)
RDW: 14.1 % (ref 11.5–15.5)
WBC: 10.4 10*3/uL (ref 4.0–10.5)

## 2015-07-01 LAB — LIPID PANEL
Cholesterol: 164 mg/dL (ref 0–200)
HDL: 31.3 mg/dL — AB (ref >39.00)
Total CHOL/HDL Ratio: 5
Triglycerides: 443 mg/dL — ABNORMAL HIGH (ref 0.0–149.0)

## 2015-07-01 LAB — LDL CHOLESTEROL, DIRECT: Direct LDL: 65 mg/dL

## 2015-07-01 LAB — TSH: TSH: 1.62 u[IU]/mL (ref 0.35–4.50)

## 2015-07-01 LAB — PSA: PSA: 0.43 ng/mL (ref 0.10–4.00)

## 2015-07-01 MED ORDER — ESZOPICLONE 2 MG PO TABS: 2.0000 mg | ORAL_TABLET | Freq: Every evening | ORAL | Status: DC | PRN

## 2015-07-01 NOTE — Progress Notes (Signed)
Subjective:    Patient ID: Corey Garrett, male    DOB: 11-Jan-1966, 50 y.o.   MRN: [id number]  HPI  Here for wellness and f/u;  Overall doing ok;  Pt denies Chest pain, worsening SOB, DOE, wheezing, orthopnea, PND, worsening LE edema, palpitations, dizziness or syncope.  Pt denies neurological change such as new headache, facial or extremity weakness.  Pt denies polydipsia, polyuria, or low sugar symptoms. Pt states overall good compliance with treatment and medications, good tolerability, and has been trying to follow appropriate diet.  Pt denies worsening depressive symptoms, suicidal ideation or panic. No fever, night sweats, wt loss, loss of appetite, or other constitutional symptoms.  Pt states good ability with ADL's, has low fall risk, home safety reviewed and adequate, no other significant changes in hearing or vision, and only occasionally active with exercise. Has put off for now the left knee torn meniscus surgury partially due to cost. Has been taking the tumeric instead.  Doing more house and yard work, just doesn't run.  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  Does not yet started asa. Last gout attack more than 1 yr ago, has taken the prednisone for 2-3 days when toe starts to stiffen.   Past Medical History  Diagnosis Date  . ANXIETY 10/07/2006  . COLONIC POLYPS, HX OF 07/10/2007  . DEPRESSION 12/29/2008  . GOUT 07/10/2007  . HEMORRHOIDS, HX OF 10/07/2006  . Hyperuricemia 04/03/2013  . Basal cell carcinoma     right shoulder blade  . Skin tag of anus    No past surgical history on file.  reports that he has quit smoking. His smoking use included Cigars. He has never used smokeless tobacco. He reports that he drinks about 2.0 - 3.0 oz of alcohol per week. He reports that he does not use illicit drugs. family history includes Anxiety disorder in his other; Autism in his daughter; Emphysema in his father. No Known Allergies Current Outpatient Prescriptions on File Prior to  Visit  Medication Sig Dispense Refill  . albuterol (PROVENTIL HFA;VENTOLIN HFA) 108 (90 BASE) MCG/ACT inhaler Inhale 2 puffs into the lungs every 6 (six) hours as needed for wheezing or shortness of breath. 1 Inhaler 0  . fenofibrate 160 MG tablet Take 1 tablet (160 mg total) by mouth daily. 90 tablet 2  . FLUoxetine (PROZAC) 20 MG tablet Take 1 tablet (20 mg total) by mouth daily. 90 tablet 3  . indomethacin (INDOCIN) 50 MG capsule Take 1 capsule (50 mg total) by mouth 3 (three) times daily as needed. prn 270 capsule 1  . meloxicam (MOBIC) 15 MG tablet Take 1 tablet (15 mg total) by mouth daily. 30 tablet 0  . Multiple Vitamin (MULTIVITAMIN) tablet Take 1 tablet by mouth daily.    . sertraline (ZOLOFT) 100 MG tablet Take 2 tablets by mouth  every day 180 tablet 0  . triamcinolone (NASACORT AQ) 55 MCG/ACT AERO nasal inhaler Place 2 sprays into the nose daily. 1 Inhaler 12   No current facility-administered medications on file prior to visit.    Review of Systems Constitutional: Negative for increased diaphoresis, or other activity, appetite or siginficant weight change other than noted HENT: Negative for worsening hearing loss, ear pain, facial swelling, mouth sores and neck stiffness.   Eyes: Negative for other worsening pain, redness or visual disturbance.  Respiratory: Negative for choking or stridor Cardiovascular: Negative for other chest pain and palpitations.  Gastrointestinal: Negative for worsening diarrhea, blood in stool, or  abdominal distention Genitourinary: Negative for hematuria, flank pain or change in urine volume.  Musculoskeletal: Negative for myalgias or other joint complaints.  Skin: Negative for other color change and wound or drainage.  Neurological: Negative for syncope and numbness. other than noted Hematological: Negative for adenopathy. or other swelling Psychiatric/Behavioral: Negative for hallucinations, SI, self-injury, decreased concentration or other  worsening agitation.      Objective:   Physical Exam BP 138/80 mmHg  Pulse 60  Temp(Src) 98.5 F (36.9 C) (Oral)  Resp 20  Wt 282 lb (127.914 kg)  SpO2 94% VS noted,  Constitutional: Pt is oriented to person, place, and time. Appears well-developed and well-nourished, in no significant distress Head: Normocephalic and atraumatic  Eyes: Conjunctivae and EOM are normal. Pupils are equal, round, and reactive to light Right Ear: External ear normal.  Left Ear: External ear normal Nose: Nose normal.  Mouth/Throat: Oropharynx is clear and moist  Neck: Normal range of motion. Neck supple. No JVD present. No tracheal deviation present or significant neck LA or mass Cardiovascular: Normal rate, regular rhythm, normal heart sounds and intact distal pulses.   Pulmonary/Chest: Effort normal and breath sounds without rales or wheezing  Abdominal: Soft. Bowel sounds are normal. NT. No HSM  Musculoskeletal: Normal range of motion. Exhibits no edema Lymphadenopathy: Has no cervical adenopathy.  Neurological: Pt is alert and oriented to person, place, and time. Pt has normal reflexes. No cranial nerve deficit. Motor grossly intact Skin: Skin is warm and dry. No rash noted or new ulcers Psychiatric:  Has normal mood and affect. Behavior is normal.     Assessment & Plan:

## 2015-07-01 NOTE — Progress Notes (Signed)
Pre visit review using our clinic review tool, if applicable. No additional management support is needed unless otherwise documented below in the visit note. 

## 2015-07-01 NOTE — Patient Instructions (Addendum)
Please start the Aspirin 81 mg - 1 per day (coated only)  Please continue all other medications as before, and refills have been done if requested.  Please have the pharmacy call with any other refills you may need.  Please continue your efforts at being more active, low cholesterol diet, and weight control.  You are otherwise up to date with prevention measures today.  Please keep your appointments with your specialists as you may have planned  Please go to the LAB in the Basement (turn left off the elevator) for the tests to be done today  You will be contacted by phone if any changes need to be made immediately.  Otherwise, you will receive a letter about your results with an explanation, but please check with MyChart first.  Please remember to sign up for MyChart if you have not done so, as this will be important to you in the future with finding out test results, communicating by private email, and scheduling acute appointments online when needed.  Please return in 1 year for your yearly visit, or sooner if needed, with Lab testing done 3-5 days before  

## 2015-07-05 NOTE — Assessment & Plan Note (Signed)

## 2015-09-11 ENCOUNTER — Encounter: Payer: Self-pay | Admitting: Internal Medicine

## 2015-09-15 ENCOUNTER — Telehealth: Payer: Self-pay

## 2015-09-15 NOTE — Telephone Encounter (Signed)
PA initiated and APPROVED via CoverMyMeds key [facility] 08/16/2015 - 09/14/2018.  Pt advised via MyChart message

## 2015-09-23 ENCOUNTER — Encounter: Payer: Self-pay | Admitting: Internal Medicine

## 2015-10-22 ENCOUNTER — Encounter: Payer: Self-pay | Admitting: Internal Medicine

## 2015-10-22 MED ORDER — INDOMETHACIN 50 MG PO CAPS: 50.0000 mg | ORAL_CAPSULE | Freq: Three times a day (TID) | ORAL | 1 refills | Status: DC | PRN

## 2015-10-22 MED ORDER — FLUOXETINE HCL 20 MG PO TABS: 20.0000 mg | ORAL_TABLET | Freq: Every day | ORAL | 3 refills | Status: DC

## 2015-12-19 ENCOUNTER — Encounter: Payer: Self-pay | Admitting: Internal Medicine

## 2016-03-27 ENCOUNTER — Other Ambulatory Visit: Payer: Self-pay | Admitting: Internal Medicine

## 2016-04-14 DIAGNOSIS — J069 Acute upper respiratory infection, unspecified: Secondary | ICD-10-CM | POA: Diagnosis not present

## 2016-04-14 DIAGNOSIS — R0602 Shortness of breath: Secondary | ICD-10-CM | POA: Diagnosis not present

## 2016-10-16 ENCOUNTER — Other Ambulatory Visit: Payer: Self-pay | Admitting: Internal Medicine

## 2016-10-18 NOTE — Telephone Encounter (Signed)
Unfortunately cannot refill lunesta as has been > 15 mo since last seen  Please consider f/u appt if needs further refills of this controlled substance

## 2016-11-08 ENCOUNTER — Encounter: Payer: Self-pay | Admitting: Family Medicine

## 2016-11-08 ENCOUNTER — Ambulatory Visit (INDEPENDENT_AMBULATORY_CARE_PROVIDER_SITE_OTHER): Payer: 59 | Admitting: Family Medicine

## 2016-11-08 ENCOUNTER — Ambulatory Visit (INDEPENDENT_AMBULATORY_CARE_PROVIDER_SITE_OTHER)
Admission: RE | Admit: 2016-11-08 | Discharge: 2016-11-08 | Disposition: A | Discharge status: Home / Self Care | Payer: 59 | Source: Ambulatory Visit | Attending: Family Medicine | Admitting: Family Medicine

## 2016-11-08 ENCOUNTER — Telehealth: Payer: Self-pay | Admitting: Family Medicine

## 2016-11-08 VITALS — BP 110/80 | HR 81 | Temp 99.1°F | Ht 74.0 in | Wt 268.0 lb

## 2016-11-08 DIAGNOSIS — M25561 Pain in right knee: Secondary | ICD-10-CM

## 2016-11-08 MED ORDER — DICLOFENAC SODIUM 2 % TD SOLN: 1.0000 "application " | Freq: Two times a day (BID) | TRANSDERMAL | 2 refills | Status: DC | PRN

## 2016-11-08 NOTE — Patient Instructions (Addendum)
Thank you for coming in,   Please try wearing compression and ice.   I will call you with the xray results.    Please feel free to call with any questions or concerns at any time, at [phone]. --Dr. Raeford Razor

## 2016-11-08 NOTE — Telephone Encounter (Signed)
Informed patient of his xray results.   Rosemarie Ax, MD [facility] 11/08/2016, 4:31 PM

## 2016-11-08 NOTE — Assessment & Plan Note (Signed)
Likely related to some underlying arthritis. Does not appear to be meniscal with no mechanical symptoms for possible for degenerative changes. Doesn't appear to be gout with no redness and ultrasound was not demonstrating any crystalline debris. - Aspiration injection today - Advised compression - Referral to physical therapy - Can take over-the-counter anti-inflammatory  -X-rays today

## 2016-11-08 NOTE — Progress Notes (Signed)
Corey Garrett - 51 y.o. male MRN [id number]  Date of birth: 1965-11-13  SUBJECTIVE:  Including CC & ROS.  Chief Complaint  Patient presents with  . Knee Pain    right knee pain. patient states he has left knee issues and he could not do the surgery on left knee and is now having troubles in left knee. states left knee has gotten progressivley worse to where if he turns wrong at night it is shooting pain    Corey Garrett is a 51 year old male is presenting with knee pain. His right knee has decreased range of motion secondary to swelling. He denies any specific injury or trigger. It has been hurting for the past few months but has cut worse over the past week. He denies any buckling popping or giving way. He has taken some over-the-counter anti-inflammatories. He has a history of left knee problems but his right knee has been otherwise normal. The pain is significant and achy in nature. Denies having an injection into his knee before.     Review of Systems  Constitutional: Negative for fever.  Musculoskeletal: Positive for arthralgias, gait problem and joint swelling.  Skin: Negative for color change.  Neurological: Negative for weakness and numbness.  Hematological: Negative for adenopathy.  otherwise negative   HISTORY: Past Medical, Surgical, Social, and Family History Reviewed & Updated per EMR.   Pertinent Historical Findings include:  Past Medical History:  Diagnosis Date  . ANXIETY 10/07/2006  . Basal cell carcinoma    right shoulder blade  . COLONIC POLYPS, HX OF 07/10/2007  . DEPRESSION 12/29/2008  . GOUT 07/10/2007  . HEMORRHOIDS, HX OF 10/07/2006  . Hyperuricemia 04/03/2013  . Skin tag of anus     History reviewed. No pertinent surgical history.  No Known Allergies  Family History  Problem Relation Age of Onset  . Emphysema Father   . Autism Daughter        epilepsy  . Anxiety disorder Other      Social History   Social History  . Marital status: Married   Spouse name: N/A  . Number of children: N/A  . Years of education: N/A   Occupational History  . [employer] manager    Social History Main Topics  . Smoking status: Former Smoker    Types: Cigars  . Smokeless tobacco: Never Used     Comment: Occasional cigar.  . Alcohol use 2.0 - 3.0 oz/week    4 - 6 Standard drinks or equivalent per week  . Drug use: No  . Sexual activity: Not on file   Other Topics Concern  . Not on file   Social History Narrative  . No narrative on file     PHYSICAL EXAM:  VS: BP 110/80 (BP Location: Left Arm, Patient Position: Sitting, Cuff Size: Normal)   Pulse 81   Temp 99.1 F (37.3 C) (Oral)   Ht 6\' 2"  (1.88 m)   Wt 268 lb (121.6 kg)   SpO2 99%   BMI 34.41 kg/m  Physical Exam Gen: NAD, alert, cooperative with exam, well-appearing ENT: normal lips, normal nasal mucosa,  Eye: normal EOM, normal conjunctiva and lids CV:  no edema, +2 pedal pulses   Resp: no accessory muscle use, non-labored,  Skin: no rashes, no areas of induration  Neuro: normal tone, normal sensation to touch Psych:  normal insight, alert and oriented MSK:  Right knee: No significant tenderness to palpation of the medial lateral joint line. No tenderness to  palpation of the quad or patellar tendon. Effusion noted. Limited range of motion extension to about 5 and flexion to about 95. Normal strength to resistance. Limping with his gait. No valgus or varus instability. Negative Thessaly test. Neurovascular intact.   Limited ultrasound: Right knee:  Moderate to significant effusion noted in the suprapatellar pouch. Out pouching of the medial and lateral meniscus.   Summary: Chronic changes to suggest arthritis  Ultrasound and interpretation by Clearance Coots, MD   Aspiration/Injection Procedure Note Corey Garrett 1965-12-09  Procedure: Aspiration and Injection Indications: Right knee pain  Procedure Details Consent: Risks of procedure as well as  the alternatives and risks of each were explained to the (patient/caregiver).  Consent for procedure obtained. Time Out: Verified patient identification, verified procedure, site/side was marked, verified correct patient position, special equipment/implants available, medications/allergies/relevent history reviewed, required imaging and test results available.  Performed.  The area was cleaned with iodine and alcohol swabs.    The right knee joint was anesthetized with 3 mL of 1% lidocaine without epinephrine with a 25-gauge needle and should not have in the superior lateral aspect. An 18-gauge needle on a 60 mL syringe was then inserted for aspiration. We then injected using 1 cc's of 40 mg Depomedrol and 4 cc's of 0.5% Marcaine a 18 1 1/2" needle.  Ultrasound was used. Images were obtained in  Long views showing the injection.    Amount of Fluid Aspirated: 68 cc Character of Fluid: cloudy and Yellow Fluid was sent for:n/a A sterile dressing was applied.  Patient did tolerate procedure well. Estimated blood loss: none            ASSESSMENT & PLAN:   Acute pain of right knee Likely related to some underlying arthritis. Does not appear to be meniscal with no mechanical symptoms for possible for degenerative changes. Doesn't appear to be gout with no redness and ultrasound was not demonstrating any crystalline debris. - Aspiration injection today - Advised compression - Referral to physical therapy - Can take over-the-counter anti-inflammatory  -X-rays today

## 2016-11-11 ENCOUNTER — Encounter: Payer: Self-pay | Admitting: Internal Medicine

## 2016-11-11 ENCOUNTER — Ambulatory Visit (INDEPENDENT_AMBULATORY_CARE_PROVIDER_SITE_OTHER): Payer: 59 | Admitting: Internal Medicine

## 2016-11-11 ENCOUNTER — Other Ambulatory Visit (INDEPENDENT_AMBULATORY_CARE_PROVIDER_SITE_OTHER): Payer: 59

## 2016-11-11 VITALS — BP 124/68 | HR 61 | Temp 98.4°F | Ht 74.0 in

## 2016-11-11 DIAGNOSIS — Z0001 Encounter for general adult medical examination with abnormal findings: Secondary | ICD-10-CM | POA: Diagnosis not present

## 2016-11-11 DIAGNOSIS — F411 Generalized anxiety disorder: Secondary | ICD-10-CM

## 2016-11-11 DIAGNOSIS — G4700 Insomnia, unspecified: Secondary | ICD-10-CM

## 2016-11-11 DIAGNOSIS — Z23 Encounter for immunization: Secondary | ICD-10-CM | POA: Diagnosis not present

## 2016-11-11 DIAGNOSIS — R21 Rash and other nonspecific skin eruption: Secondary | ICD-10-CM | POA: Diagnosis not present

## 2016-11-11 DIAGNOSIS — R739 Hyperglycemia, unspecified: Secondary | ICD-10-CM

## 2016-11-11 LAB — CBC WITH DIFFERENTIAL/PLATELET
BASOS PCT: 0.8 % (ref 0.0–3.0)
Basophils Absolute: 0 10*3/uL (ref 0.0–0.1)
EOS ABS: 0.2 10*3/uL (ref 0.0–0.7)
Eosinophils Relative: 2.4 % (ref 0.0–5.0)
HEMATOCRIT: 43.3 % (ref 39.0–52.0)
Hemoglobin: 14.8 g/dL (ref 13.0–17.0)
LYMPHS PCT: 25.1 % (ref 12.0–46.0)
Lymphs Abs: 1.6 10*3/uL (ref 0.7–4.0)
MCHC: 34.2 g/dL (ref 30.0–36.0)
MCV: 81.8 fl (ref 78.0–100.0)
Monocytes Absolute: 0.4 10*3/uL (ref 0.1–1.0)
Monocytes Relative: 6.3 % (ref 3.0–12.0)
NEUTROS ABS: 4.3 10*3/uL (ref 1.4–7.7)
Neutrophils Relative %: 65.4 % (ref 43.0–77.0)
Platelets: 222 10*3/uL (ref 150.0–400.0)
RBC: 5.29 Mil/uL (ref 4.22–5.81)
RDW: 14.1 % (ref 11.5–15.5)
WBC: 6.6 10*3/uL (ref 4.0–10.5)

## 2016-11-11 LAB — URINALYSIS, ROUTINE W REFLEX MICROSCOPIC
Bilirubin Urine: NEGATIVE
HGB URINE DIPSTICK: NEGATIVE
Ketones, ur: NEGATIVE
Leukocytes, UA: NEGATIVE
Nitrite: NEGATIVE
Specific Gravity, Urine: 1.02 (ref 1.000–1.030)
Total Protein, Urine: NEGATIVE
Urine Glucose: NEGATIVE
Urobilinogen, UA: 0.2 (ref 0.0–1.0)
pH: 5.5 (ref 5.0–8.0)

## 2016-11-11 LAB — LIPID PANEL
CHOLESTEROL: 157 mg/dL (ref 0–200)
HDL: 27.6 mg/dL — ABNORMAL LOW (ref >39.00)
LDL Cholesterol: 89 mg/dL (ref 0–99)
NonHDL: 129.03
Total CHOL/HDL Ratio: 6
Triglycerides: 199 mg/dL — ABNORMAL HIGH (ref 0.0–149.0)
VLDL: 39.8 mg/dL (ref 0.0–40.0)

## 2016-11-11 LAB — BASIC METABOLIC PANEL
BUN: 24 mg/dL — ABNORMAL HIGH (ref 6–23)
CHLORIDE: 104 meq/L (ref 96–112)
CO2: 30 mEq/L (ref 19–32)
CREATININE: 0.98 mg/dL (ref 0.40–1.50)
Calcium: 9.4 mg/dL (ref 8.4–10.5)
GFR: 85.55 mL/min (ref >60.00)
Glucose, Bld: 101 mg/dL — ABNORMAL HIGH (ref 70–99)
POTASSIUM: 4 meq/L (ref 3.5–5.1)
Sodium: 142 mEq/L (ref 135–145)

## 2016-11-11 LAB — HEPATIC FUNCTION PANEL
ALT: 18 U/L (ref 0–53)
AST: 16 U/L (ref 0–37)
Albumin: 4.3 g/dL (ref 3.5–5.2)
Alkaline Phosphatase: 100 U/L (ref 39–117)
BILIRUBIN DIRECT: 0.1 mg/dL (ref 0.0–0.3)
BILIRUBIN TOTAL: 0.6 mg/dL (ref 0.2–1.2)
Total Protein: 7.1 g/dL (ref 6.0–8.3)

## 2016-11-11 LAB — PSA: PSA: 0.43 ng/mL (ref 0.10–4.00)

## 2016-11-11 LAB — HEMOGLOBIN A1C: HEMOGLOBIN A1C: 5.2 % (ref 4.6–6.5)

## 2016-11-11 LAB — TSH: TSH: 1.44 u[IU]/mL (ref 0.35–4.50)

## 2016-11-11 MED ORDER — INDOMETHACIN 50 MG PO CAPS: 50.0000 mg | ORAL_CAPSULE | Freq: Three times a day (TID) | ORAL | 1 refills | Status: DC | PRN

## 2016-11-11 MED ORDER — KETOCONAZOLE 2 % EX CREA: 1.0000 "application " | TOPICAL_CREAM | Freq: Two times a day (BID) | CUTANEOUS | 1 refills | Status: DC | PRN

## 2016-11-11 MED ORDER — FLUOXETINE HCL 40 MG PO CAPS: 40.0000 mg | ORAL_CAPSULE | Freq: Every day | ORAL | 3 refills | Status: DC

## 2016-11-11 MED ORDER — TRAZODONE HCL 50 MG PO TABS: 25.0000 mg | ORAL_TABLET | Freq: Every evening | ORAL | 1 refills | Status: DC | PRN

## 2016-11-11 MED ORDER — FENOFIBRATE 160 MG PO TABS: 160.0000 mg | ORAL_TABLET | Freq: Every day | ORAL | 3 refills | Status: DC

## 2016-11-11 NOTE — Patient Instructions (Addendum)
You had the Tdap tetanus shot today  OK to increase the prozac to 40 mg per day  Please take all new medication as prescribed - the trazodone (stop the lunesta), and the ketoconozole cream as needed for the foot rash  Please continue all other medications as before, and refills have been done if requested.  Please have the pharmacy call with any other refills you may need.  Please continue your efforts at being more active, low cholesterol diet, and weight control.  You are otherwise up to date with prevention measures today.  Please keep your appointments with your specialists as you may have planned  Please go to the LAB in the Basement (turn left off the elevator) for the tests to be done today  You will be contacted by phone if any changes need to be made immediately.  Otherwise, you will receive a letter about your results with an explanation, but please check with MyChart first.  Please remember to sign up for MyChart if you have not done so, as this will be important to you in the future with finding out test results, communicating by private email, and scheduling acute appointments online when needed.  Please return in 1 year for your yearly visit, or sooner if needed, with Lab testing done 3-5 days before

## 2016-11-11 NOTE — Progress Notes (Signed)
Subjective:    Patient ID: Corey Garrett, male    DOB: 1965-09-19, 51 y.o.   MRN: [id number]  HPI  Here for wellness and f/u;  Overall doing ok;  Pt denies Chest pain, worsening SOB, DOE, wheezing, orthopnea, PND, worsening LE edema, palpitations, dizziness or syncope.  Pt denies neurological change such as new headache, facial or extremity weakness.  Pt denies polydipsia, polyuria, or low sugar symptoms. Pt states overall good compliance with treatment and medications, good tolerability, and has been trying to follow appropriate diet.  Pt denies worsening depressive symptoms, suicidal ideation or panic. No fever, night sweats, wt loss, loss of appetite, or other constitutional symptoms.  Pt states good ability with ADL's, has low fall risk, home safety reviewed and adequate, no other significant changes in hearing or vision, and only occasionally active with exercise. Drives minimum 4 hrs per day for work, has autistic daughter at home, very hard to find time for exercise.    He is out of fenofibrate, tyring to work on diet and excercise. Will restart if needed. Wife is prediabetic, and cbgs at home for him are 104-108.  S/p right knee cortisone last wk per sports medicine. Does have some blistering to right instep now healed  Has callous to right great medial toe after a sore hasnt healed well in last few months.  Has pain to flat feet occasioanlly and plans to restart the inserts soon. Asks for increased prozac due to improved but persistent significant anxieyt and stress.  Also had to stop the lunesta due to still waking at 4am daily, and side effect hangover the next am.  Asks for different med.   Past Medical History:  Diagnosis Date  . ANXIETY 10/07/2006  . Basal cell carcinoma    right shoulder blade  . COLONIC POLYPS, HX OF 07/10/2007  . DEPRESSION 12/29/2008  . GOUT 07/10/2007  . HEMORRHOIDS, HX OF 10/07/2006  . Hyperuricemia 04/03/2013  . Skin tag of anus    No past surgical history on  file.  reports that he has quit smoking. His smoking use included Cigars. He has never used smokeless tobacco. He reports that he drinks about 2.0 - 3.0 oz of alcohol per week . He reports that he does not use drugs. family history includes Anxiety disorder in his other; Autism in his daughter; Emphysema in his father. No Known Allergies Current Outpatient Prescriptions on File Prior to Visit  Medication Sig Dispense Refill  . Multiple Vitamin (MULTIVITAMIN) tablet Take 1 tablet by mouth daily.     No current facility-administered medications on file prior to visit.    Review of Systems Constitutional: Negative for other unusual diaphoresis, sweats, appetite or weight changes HENT: Negative for other worsening hearing loss, ear pain, facial swelling, mouth sores or neck stiffness.   Eyes: Negative for other worsening pain, redness or other visual disturbance.  Respiratory: Negative for other stridor or swelling Cardiovascular: Negative for other palpitations or other chest pain  Gastrointestinal: Negative for worsening diarrhea or loose stools, blood in stool, distention or other pain Genitourinary: Negative for hematuria, flank pain or other change in urine volume.  Musculoskeletal: Negative for myalgias or other joint swelling.  Skin: Negative for other color change, or other wound or worsening drainage.  Neurological: Negative for other syncope or numbness. Hematological: Negative for other adenopathy or swelling Psychiatric/Behavioral: Negative for hallucinations, other worsening agitation, SI, self-injury, or new decreased concentration All other system neg per pt    Objective:  Physical Exam BP 124/68   Pulse 61   Temp 98.4 F (36.9 C) (Oral)   Ht 6\' 2"  (1.88 m)   SpO2 99%  VS noted, obese Constitutional: Pt is oriented to person, place, and time. Appears well-developed and well-nourished, in no significant distress and comfortable Head: Normocephalic and atraumatic  Eyes:  Conjunctivae and EOM are normal. Pupils are equal, round, and reactive to light Right Ear: External ear normal without discharge Left Ear: External ear normal without discharge Nose: Nose without discharge or deformity Mouth/Throat: Oropharynx is without other ulcerations and moist  Neck: Normal range of motion. Neck supple. No JVD present. No tracheal deviation present or significant neck LA or mass Cardiovascular: Normal rate, regular rhythm, normal heart sounds and intact distal pulses.   Pulmonary/Chest: WOB normal and breath sounds without rales or wheezing  Abdominal: Soft. Bowel sounds are normal. NT. No HSM  Musculoskeletal: Normal range of motion. Exhibits no edema Lymphadenopathy: Has no other cervical adenopathy.  Neurological: Pt is alert and oriented to person, place, and time. Pt has normal reflexes. No cranial nerve deficit. Motor grossly intact, Gait intact Skin: Skin is warm and dry. No rash noted except for healing blisters small to right instep and scaly whitich rash, no new ulcerations, has significant right medial great toe callous without breakdown Psychiatric:  Has normal mood and afsect. Behavior is normal without agitation No other exam findings Lab Results  Component Value Date   WBC 10.4 07/01/2015   HGB 15.2 07/01/2015   HCT 44.0 07/01/2015   PLT 206.0 07/01/2015   GLUCOSE 92 07/01/2015   CHOL 164 07/01/2015   TRIG (H) 07/01/2015    443.0 Triglyceride is over 400; calculations on Lipids are invalid.   HDL 31.30 (L) 07/01/2015   LDLDIRECT 65.0 07/01/2015   LDLCALC 82 01/04/2014   ALT 22 07/01/2015   AST 26 07/01/2015   NA 141 07/01/2015   K 3.8 07/01/2015   CL 103 07/01/2015   CREATININE 0.98 07/01/2015   BUN 17 07/01/2015   CO2 30 07/01/2015   TSH 1.62 07/01/2015   PSA 0.43 07/01/2015   HGBA1C 5.5 01/04/2014       Assessment & Plan:

## 2016-11-12 DIAGNOSIS — R21 Rash and other nonspecific skin eruption: Secondary | ICD-10-CM | POA: Insufficient documentation

## 2016-11-12 NOTE — Assessment & Plan Note (Signed)
Mild to mod, suspect superficial fungal infection to right instep, for ketoconozole cream prn,  to f/u any worsening symptoms or concerns

## 2016-11-12 NOTE — Assessment & Plan Note (Addendum)
Mild to mod, for trazodone qhs prn,  to f/u any worsening symptoms or concerns  In addition to the time spent performing CPE, I spent an additional 25 minutes face to face,in which greater than 50% of this time was spent in counseling and coordination of care for patient's acute illness as documented, including the differential dx, tx, further evaluation and other management of insomnia, anxiety disorder, rash and hyperglycemia

## 2016-11-12 NOTE — Assessment & Plan Note (Signed)
Mild to mod uncontrolled, for increased prozac 40 qd,  to f/u any worsening symptoms or concerns

## 2016-11-12 NOTE — Assessment & Plan Note (Signed)
Asympt,  Lab Results  Component Value Date   HGBA1C 5.2 11/11/2016  stable overall by history and exam, recent data reviewed with pt, and pt to continue medical treatment as before,  to f/u any worsening symptoms or concerns

## 2016-11-12 NOTE — Assessment & Plan Note (Signed)

## 2016-11-24 DIAGNOSIS — M62552 Muscle wasting and atrophy, not elsewhere classified, left thigh: Secondary | ICD-10-CM | POA: Diagnosis not present

## 2016-11-24 DIAGNOSIS — M62551 Muscle wasting and atrophy, not elsewhere classified, right thigh: Secondary | ICD-10-CM | POA: Diagnosis not present

## 2016-11-24 DIAGNOSIS — M25661 Stiffness of right knee, not elsewhere classified: Secondary | ICD-10-CM | POA: Diagnosis not present

## 2016-11-28 DIAGNOSIS — M62551 Muscle wasting and atrophy, not elsewhere classified, right thigh: Secondary | ICD-10-CM | POA: Diagnosis not present

## 2016-11-28 DIAGNOSIS — M25661 Stiffness of right knee, not elsewhere classified: Secondary | ICD-10-CM | POA: Diagnosis not present

## 2016-11-28 DIAGNOSIS — M62552 Muscle wasting and atrophy, not elsewhere classified, left thigh: Secondary | ICD-10-CM | POA: Diagnosis not present

## 2016-12-01 DIAGNOSIS — M62552 Muscle wasting and atrophy, not elsewhere classified, left thigh: Secondary | ICD-10-CM | POA: Diagnosis not present

## 2016-12-01 DIAGNOSIS — M25661 Stiffness of right knee, not elsewhere classified: Secondary | ICD-10-CM | POA: Diagnosis not present

## 2016-12-01 DIAGNOSIS — M62551 Muscle wasting and atrophy, not elsewhere classified, right thigh: Secondary | ICD-10-CM | POA: Diagnosis not present

## 2016-12-05 DIAGNOSIS — M25661 Stiffness of right knee, not elsewhere classified: Secondary | ICD-10-CM | POA: Diagnosis not present

## 2016-12-05 DIAGNOSIS — M62551 Muscle wasting and atrophy, not elsewhere classified, right thigh: Secondary | ICD-10-CM | POA: Diagnosis not present

## 2016-12-05 DIAGNOSIS — M62552 Muscle wasting and atrophy, not elsewhere classified, left thigh: Secondary | ICD-10-CM | POA: Diagnosis not present

## 2016-12-08 DIAGNOSIS — M25661 Stiffness of right knee, not elsewhere classified: Secondary | ICD-10-CM | POA: Diagnosis not present

## 2016-12-08 DIAGNOSIS — M62551 Muscle wasting and atrophy, not elsewhere classified, right thigh: Secondary | ICD-10-CM | POA: Diagnosis not present

## 2016-12-08 DIAGNOSIS — M62552 Muscle wasting and atrophy, not elsewhere classified, left thigh: Secondary | ICD-10-CM | POA: Diagnosis not present

## 2016-12-12 DIAGNOSIS — M62551 Muscle wasting and atrophy, not elsewhere classified, right thigh: Secondary | ICD-10-CM | POA: Diagnosis not present

## 2016-12-12 DIAGNOSIS — M25661 Stiffness of right knee, not elsewhere classified: Secondary | ICD-10-CM | POA: Diagnosis not present

## 2016-12-12 DIAGNOSIS — M62552 Muscle wasting and atrophy, not elsewhere classified, left thigh: Secondary | ICD-10-CM | POA: Diagnosis not present

## 2016-12-14 DIAGNOSIS — M62551 Muscle wasting and atrophy, not elsewhere classified, right thigh: Secondary | ICD-10-CM | POA: Diagnosis not present

## 2016-12-14 DIAGNOSIS — M62552 Muscle wasting and atrophy, not elsewhere classified, left thigh: Secondary | ICD-10-CM | POA: Diagnosis not present

## 2016-12-14 DIAGNOSIS — M25661 Stiffness of right knee, not elsewhere classified: Secondary | ICD-10-CM | POA: Diagnosis not present

## 2016-12-19 DIAGNOSIS — M62551 Muscle wasting and atrophy, not elsewhere classified, right thigh: Secondary | ICD-10-CM | POA: Diagnosis not present

## 2016-12-19 DIAGNOSIS — M25661 Stiffness of right knee, not elsewhere classified: Secondary | ICD-10-CM | POA: Diagnosis not present

## 2016-12-19 DIAGNOSIS — M62552 Muscle wasting and atrophy, not elsewhere classified, left thigh: Secondary | ICD-10-CM | POA: Diagnosis not present

## 2016-12-22 DIAGNOSIS — M62551 Muscle wasting and atrophy, not elsewhere classified, right thigh: Secondary | ICD-10-CM | POA: Diagnosis not present

## 2016-12-22 DIAGNOSIS — M25661 Stiffness of right knee, not elsewhere classified: Secondary | ICD-10-CM | POA: Diagnosis not present

## 2016-12-22 DIAGNOSIS — M62552 Muscle wasting and atrophy, not elsewhere classified, left thigh: Secondary | ICD-10-CM | POA: Diagnosis not present

## 2017-01-18 DIAGNOSIS — D1801 Hemangioma of skin and subcutaneous tissue: Secondary | ICD-10-CM | POA: Diagnosis not present

## 2017-01-18 DIAGNOSIS — C44619 Basal cell carcinoma of skin of left upper limb, including shoulder: Secondary | ICD-10-CM | POA: Diagnosis not present

## 2017-01-18 DIAGNOSIS — D225 Melanocytic nevi of trunk: Secondary | ICD-10-CM | POA: Diagnosis not present

## 2017-01-18 DIAGNOSIS — D485 Neoplasm of uncertain behavior of skin: Secondary | ICD-10-CM | POA: Diagnosis not present

## 2017-01-18 DIAGNOSIS — L570 Actinic keratosis: Secondary | ICD-10-CM | POA: Diagnosis not present

## 2017-01-18 DIAGNOSIS — L918 Other hypertrophic disorders of the skin: Secondary | ICD-10-CM | POA: Diagnosis not present

## 2017-03-24 ENCOUNTER — Encounter: Payer: Self-pay | Admitting: Family Medicine

## 2017-03-24 ENCOUNTER — Ambulatory Visit: Payer: 59 | Admitting: Family Medicine

## 2017-03-24 VITALS — BP 138/84 | HR 76 | Temp 98.5°F | Ht 74.0 in | Wt 289.0 lb

## 2017-03-24 DIAGNOSIS — M25462 Effusion, left knee: Secondary | ICD-10-CM

## 2017-03-24 NOTE — Assessment & Plan Note (Addendum)
He has had effusions of this knee previously. Review of the synovial fluid from 2016 shows it was cloudy with no crystals. Possible that he could be getting this much of an effusion from a meniscal tear and arthritis. - Aspiration and injection today - Synovial fluid analysis - May be worth obtaining a uric acid level at some point

## 2017-03-24 NOTE — Progress Notes (Signed)
Corey Garrett - 52 y.o. male MRN [id number]  Date of birth: 1966-01-15  SUBJECTIVE:  Including CC & ROS.  Chief Complaint  Patient presents with  . left knee swelling    Corey Garrett is a 52 y.o. male that is here for left knee swelling. He completed eight weeks of physical therapy for his right knee. The swelling has been ongoing for two days. He has a history of Gout. States he has a constant pain on the medial aspect of his left knee. Pain is severe upon flexion. Denies injury to his knee. Pain is localized to the knee. He feels like his left knee may give way from time to time. He denies any redness or streaking.   Independent review of his left knee MRI from 2016 shows an oblique tear of the posterior horn of the medial meniscus. Small Baker's cyst  Independent review of the left knee x-ray from 2016 shows minimal medial joint space narrowing.   Review of Systems  Constitutional: Negative for fever.  Respiratory: Negative for shortness of breath.   Cardiovascular: Negative for chest pain.  Gastrointestinal: Negative for abdominal pain.  Musculoskeletal: Positive for arthralgias and joint swelling.  Skin: Negative for color change.  Neurological: Negative for weakness.  Hematological: Negative for adenopathy.  Psychiatric/Behavioral: Negative for agitation.    HISTORY: Past Medical, Surgical, Social, and Family History Reviewed & Updated per EMR.   Pertinent Historical Findings include:  Past Medical History:  Diagnosis Date  . ANXIETY 10/07/2006  . Basal cell carcinoma    right shoulder blade  . COLONIC POLYPS, HX OF 07/10/2007  . DEPRESSION 12/29/2008  . GOUT 07/10/2007  . HEMORRHOIDS, HX OF 10/07/2006  . Hyperuricemia 04/03/2013  . Skin tag of anus     No past surgical history on file.  No Known Allergies  Family History  Problem Relation Age of Onset  . Emphysema Father   . Autism Daughter        epilepsy  . Anxiety disorder Other      Social History    Socioeconomic History  . Marital status: Married    Spouse name: Not on file  . Number of children: Not on file  . Years of education: Not on file  . Highest education level: Not on file  Social Needs  . Financial resource strain: Not on file  . Food insecurity - worry: Not on file  . Food insecurity - inability: Not on file  . Transportation needs - medical: Not on file  . Transportation needs - non-medical: Not on file  Occupational History  . Occupation: [employer] manager  Tobacco Use  . Smoking status: Former Smoker    Types: Cigars  . Smokeless tobacco: Never Used  . Tobacco comment: Occasional cigar.  Substance and Sexual Activity  . Alcohol use: Yes    Alcohol/week: 2.0 - 3.0 oz    Types: 4 - 6 Standard drinks or equivalent per week  . Drug use: No  . Sexual activity: Not on file  Other Topics Concern  . Not on file  Social History Narrative  . Not on file     PHYSICAL EXAM:  VS: BP 138/84 (BP Location: Left Arm, Patient Position: Sitting, Cuff Size: Normal)   Pulse 76   Temp 98.5 F (36.9 C) (Oral)   Ht 6\' 2"  (1.88 m)   Wt 289 lb (131.1 kg)   SpO2 98%   BMI 37.11 kg/m  Physical Exam Gen: NAD, alert, cooperative with  exam, well-appearing ENT: normal lips, normal nasal mucosa,  Eye: normal EOM, normal conjunctiva and lids CV:  no edema, +2 pedal pulses   Resp: no accessory muscle use, non-labored,  Skin: no rashes, no areas of induration  Neuro: normal tone, normal sensation to touch Psych:  normal insight, alert and oriented MSK:  Left knee:  Significant effusion present. Limited flexion. Normal strength to resistance. Normal gait. Some pain with McMurray's testing. No pain with patellar grind. Neurovascularly intact    Aspiration/Injection Procedure Note Corey Garrett 10-22-1965  Procedure: Aspiration and Injection Indications: left knee pain and effusion   Procedure Details Consent: Risks of procedure as well as the  alternatives and risks of each were explained to the (patient/caregiver).  Consent for procedure obtained. Time Out: Verified patient identification, verified procedure, site/side was marked, verified correct patient position, special equipment/implants available, medications/allergies/relevent history reviewed, required imaging and test results available.  Performed.  The area was cleaned with iodine and alcohol swabs.    The left knee joint was approached in the superlateral aspect. 5 cc of 1% lidocaine was used to anestherize the tract and the skin. An 18 guague needle was then inserted to aspirate fluid under ultrasound guidance. A mixture of 1 cc's of 40 mg kenalog and 4 cc's of 0.25% bupivacaine was then injected.  Ultrasound was used. Images were obtained in  Long views showing the injection.    Amount of Fluid Aspirated: 56mL Character of Fluid: straw colored and cloudy Fluid was sent HYI:FOYD count and crystal identification A sterile dressing was applied.  Patient did tolerate procedure well.       ASSESSMENT & PLAN:   Effusion of left knee He has had effusions of this knee previously. Review of the synovial fluid from 2016 shows it was cloudy with no crystals. Possible that he could be getting this much of an effusion from a meniscal tear and arthritis. - Aspiration and injection today - Synovial fluid analysis - May be worth obtaining a uric acid level at some point

## 2017-03-24 NOTE — Patient Instructions (Signed)
We will call you with the results from today.  

## 2017-03-25 LAB — TIQ-NTM

## 2017-03-27 ENCOUNTER — Telehealth: Payer: Self-pay | Admitting: Family Medicine

## 2017-03-27 ENCOUNTER — Telehealth: Payer: Self-pay

## 2017-03-27 DIAGNOSIS — M10062 Idiopathic gout, left knee: Secondary | ICD-10-CM

## 2017-03-27 DIAGNOSIS — M1A0620 Idiopathic chronic gout, left knee, without tophus (tophi): Secondary | ICD-10-CM

## 2017-03-27 LAB — SYNOVIAL CELL COUNT + DIFF, W/ CRYSTALS
Basophils, %: 0 %
Eosinophils-Synovial: 0 % (ref 0–2)
LYMPHOCYTES-SYNOVIAL FLD: 7 % (ref 0–74)
MONOCYTE/MACROPHAGE: 5 % (ref 0–69)
Neutrophil, Synovial: 88 % — ABNORMAL HIGH (ref 0–24)
Synoviocytes, %: 0 % (ref 0–15)
WBC, SYNOVIAL: 10450 {cells}/uL — AB (ref <150)

## 2017-03-27 NOTE — Addendum Note (Signed)
Addended by: Verlene Mayer A on: 03/27/2017 04:43 PM   Modules accepted: Orders

## 2017-03-27 NOTE — Telephone Encounter (Signed)
Copied from [place] [id number]. Topic: Quick Communication - See Telephone Encounter >> Mar 27, 2017 10:23 AM Ahmed Prima L wrote: CRM for notification. See Telephone encounter for:   03/27/17.  Pt is requesting his results from his left knee that was drained on [facility] @ [facility]. Was advised that the results would be at the front desk. Please call pt @ [phone]

## 2017-03-27 NOTE — Telephone Encounter (Signed)
Left VM for patient. If he calls back please have him speak with a nurse/CMA and inform that his aspiration shows crystals to suggest gout. I would consider getting a uric acid. May need to be started on a controller medication for gout.   If any questions then please take the best time and phone number to call and I will try to call him back.   Rosemarie Ax, MD [facility] 03/27/2017, 9:59 AM

## 2017-03-27 NOTE — Telephone Encounter (Signed)
Patient informed as instructed, verbalized understanding. Patient will be out of town this week will get uric acid completed next week.

## 2017-03-31 ENCOUNTER — Other Ambulatory Visit (INDEPENDENT_AMBULATORY_CARE_PROVIDER_SITE_OTHER): Payer: 59

## 2017-03-31 DIAGNOSIS — M1A0620 Idiopathic chronic gout, left knee, without tophus (tophi): Secondary | ICD-10-CM | POA: Diagnosis not present

## 2017-03-31 LAB — URIC ACID: Uric Acid, Serum: 7.5 mg/dL (ref 4.0–7.8)

## 2017-03-31 MED ORDER — ALLOPURINOL 100 MG PO TABS: 100.0000 mg | ORAL_TABLET | Freq: Every day | ORAL | 1 refills | Status: DC

## 2017-03-31 NOTE — Telephone Encounter (Signed)
Called and spoke with patient about uric acid. Likely has had two recent gout flares and most recent aspiration showed crystals. Would try allopurinol and then place a future uric acid show can determine if uric acid has decreased.   Rosemarie Ax, MD [facility] 03/31/2017, 4:18 PM

## 2017-04-11 NOTE — Telephone Encounter (Signed)
03/27/17 Lab Tech called [facility] regarding fax recieved for "no test indicated for specimens". Lab tech explained that additional tubes were sent to ensure enough of the specimen was availabe to test.

## 2017-05-03 DIAGNOSIS — C44629 Squamous cell carcinoma of skin of left upper limb, including shoulder: Secondary | ICD-10-CM | POA: Diagnosis not present

## 2017-07-28 ENCOUNTER — Other Ambulatory Visit: Payer: Self-pay | Admitting: Internal Medicine

## 2017-08-09 DIAGNOSIS — L821 Other seborrheic keratosis: Secondary | ICD-10-CM | POA: Diagnosis not present

## 2017-08-09 DIAGNOSIS — D225 Melanocytic nevi of trunk: Secondary | ICD-10-CM | POA: Diagnosis not present

## 2017-08-09 DIAGNOSIS — L570 Actinic keratosis: Secondary | ICD-10-CM | POA: Diagnosis not present

## 2017-08-09 DIAGNOSIS — D1801 Hemangioma of skin and subcutaneous tissue: Secondary | ICD-10-CM | POA: Diagnosis not present

## 2017-09-17 IMAGING — MR MR KNEE*L* W/O CM
4 of 5 series · 18 of 40 positions shown · non-contrast
Comparison: None.

CLINICAL DATA: Left knee pain for 4-6 months.  No acute trauma.

EXAM:
MRI OF THE LEFT KNEE WITHOUT CONTRAST
TECHNIQUE: Multiplanar, multisequence MR imaging of the knee was performed. No
intravenous contrast was administered.

[Series 3: pd_tse_fs_tra · axial · 4.0mm · 0.42mm/px · z∈[-38,+47]mm · 3 of 25 slices shown]
[im 4/25]
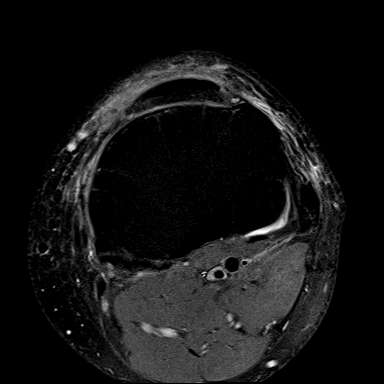
[im 14/25]
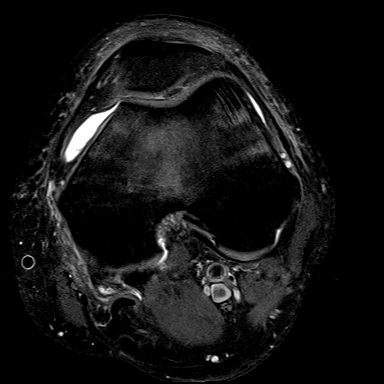
[im 21/25]
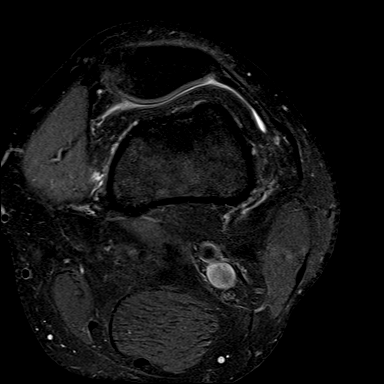

[Series 5: T2 fat-sat · coronal · 3.5mm · 0.62mm/px · 4 of 26 slices shown]
[im 1/26]
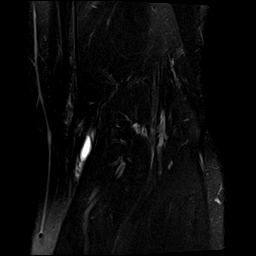
[im 4/26]
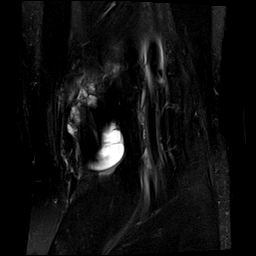
[im 15/26]
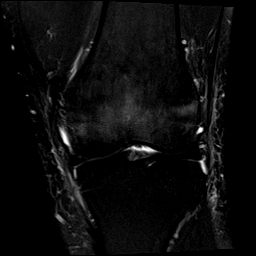
[im 22/26]
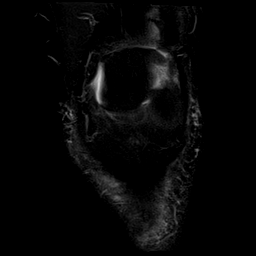

[Series 7: PD fat-sat · sagittal · 3.5mm · 0.25mm/px · 8 of 27 slices shown]
[im 1/27]
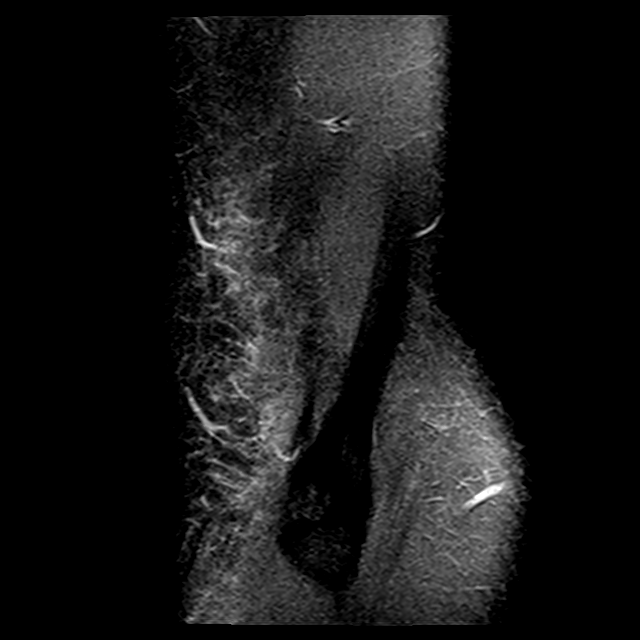
[im 4/27]
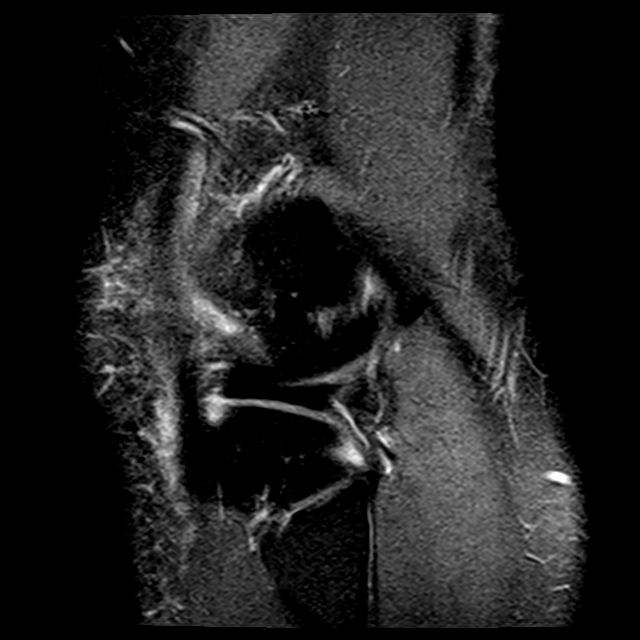
[im 8/27]
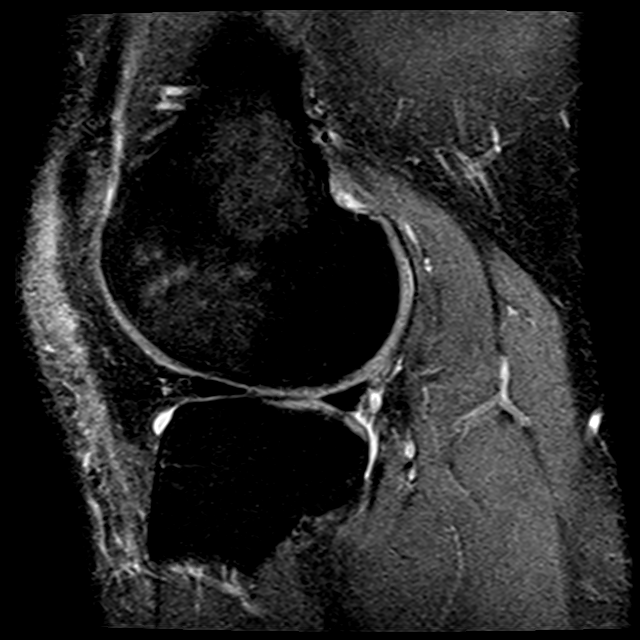
[im 12/27]
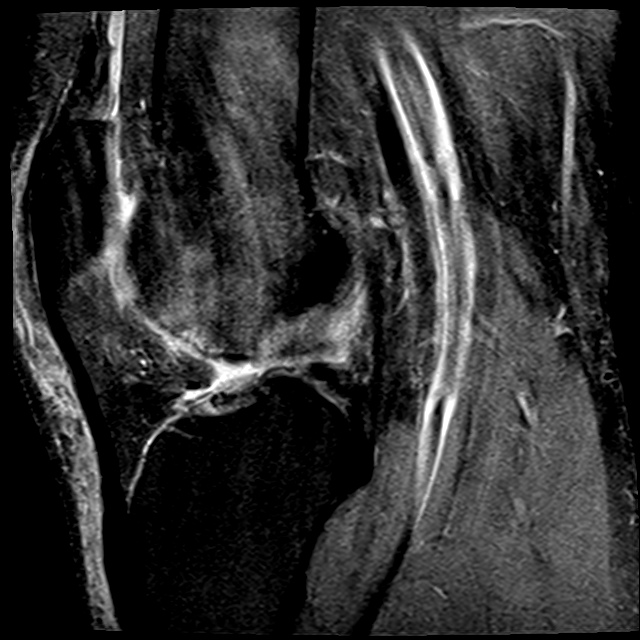
[im 15/27]
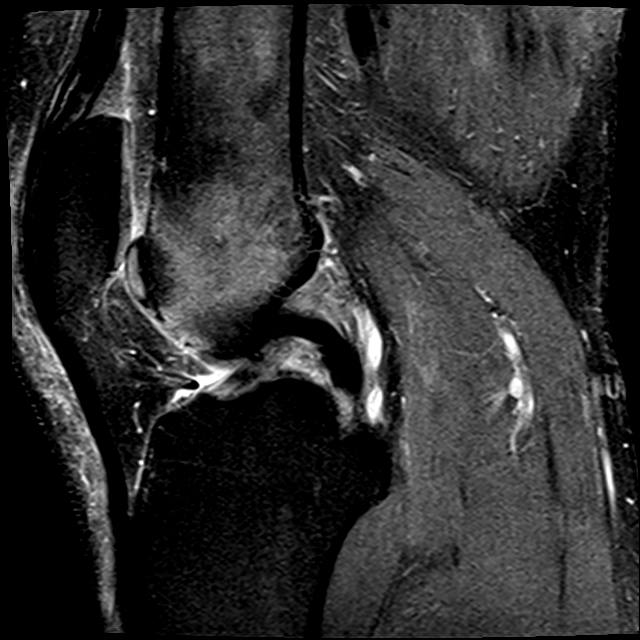
[im 19/27]
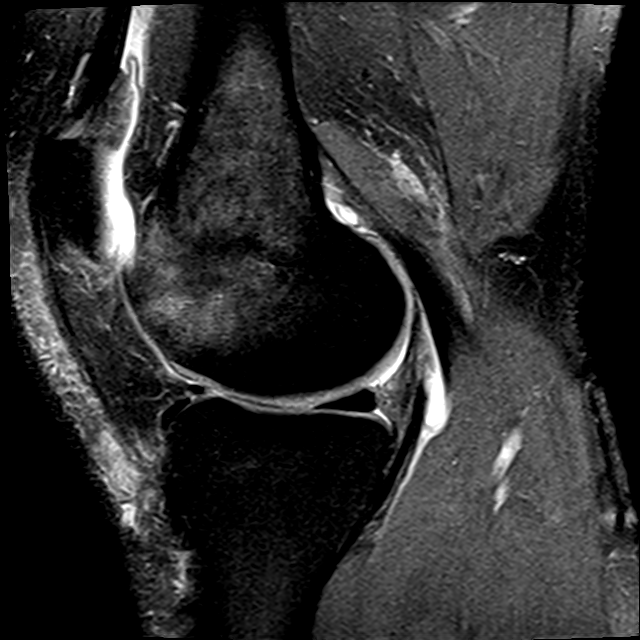
[im 23/27]
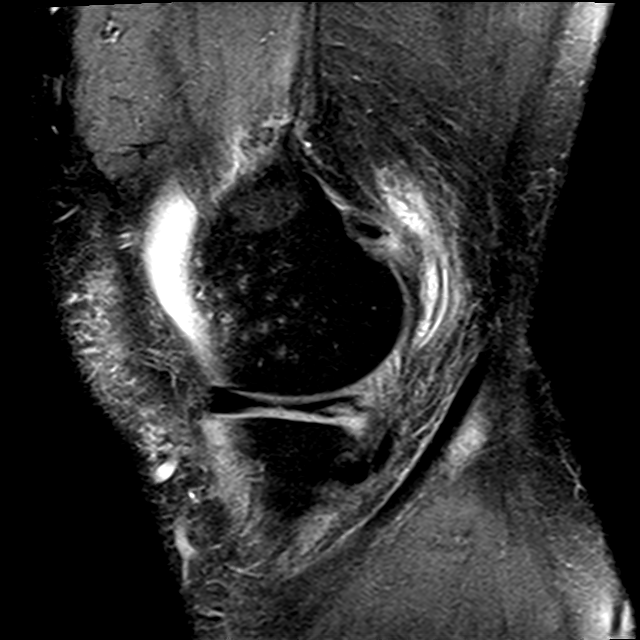
[im 27/27]
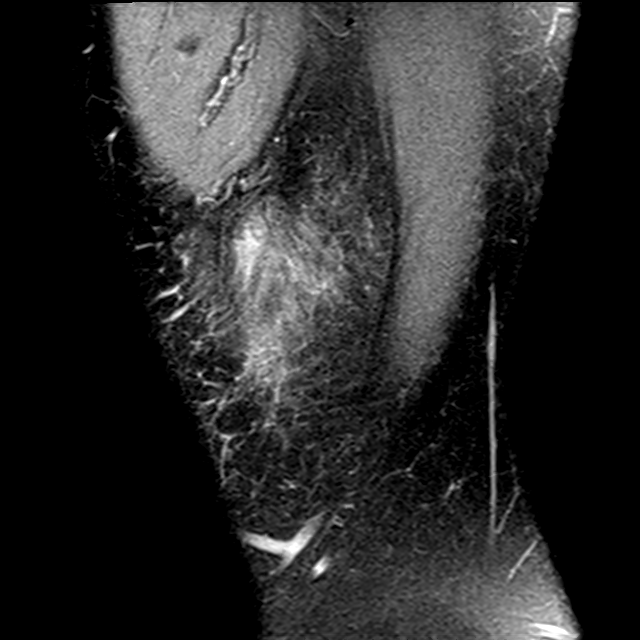

[Series 8: T1 · coronal · 3.5mm · 0.27mm/px · 3 of 26 slices shown]
[im 4/26]
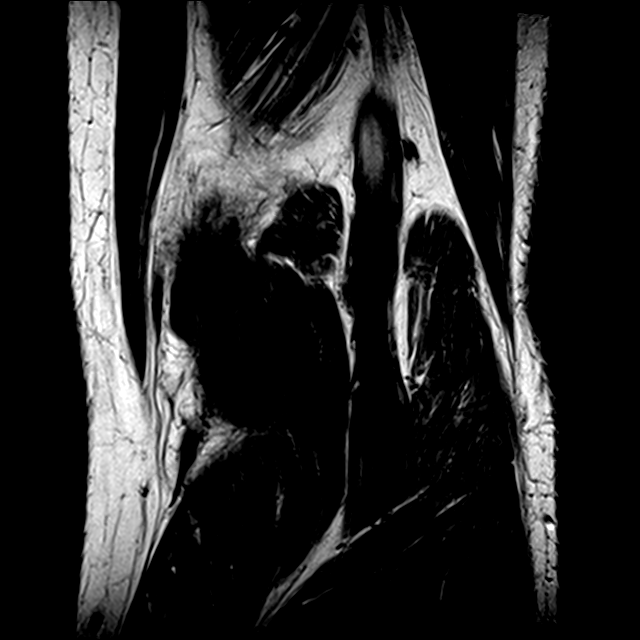
[im 15/26]
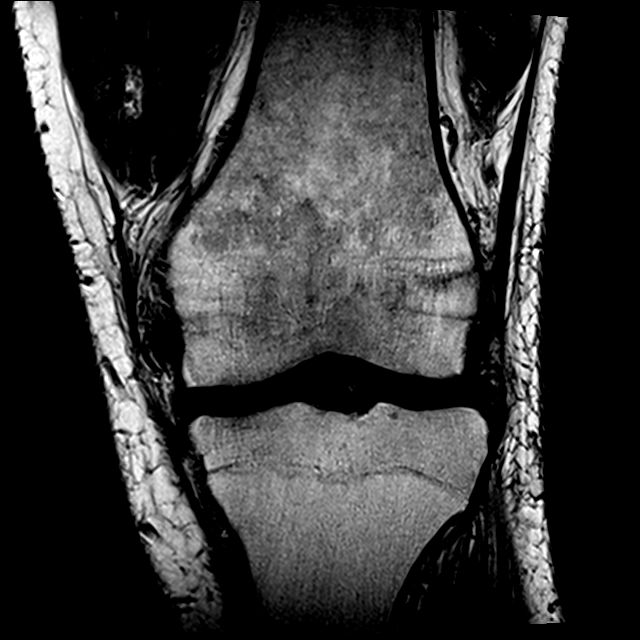
[im 22/26]
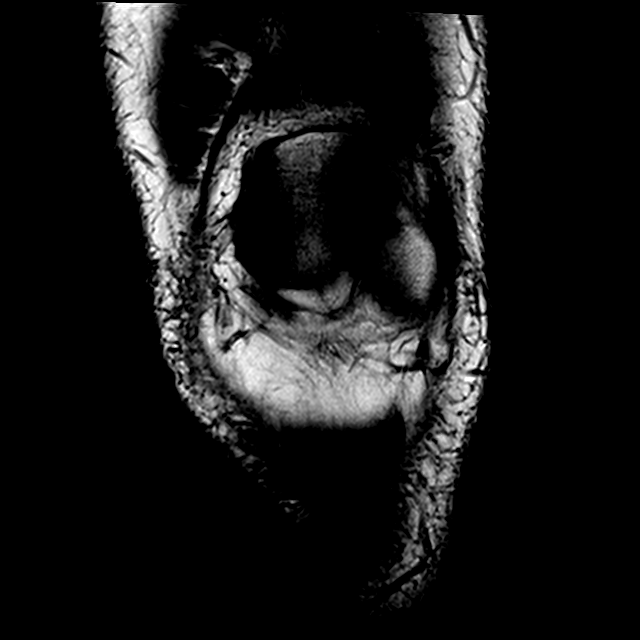

[18 of 40 positions shown; findings below may reference images not displayed]

FINDINGS: MENISCI

Medial meniscus: Oblique tear of the posterior horn of the medial
meniscus extending to the inferior articular surface.

Lateral meniscus:  Intact.

LIGAMENTS

Cruciates: Intact ACL and PCL. Mild fluid signal in the ACL likely
reflecting mild mucinous degeneration.

Collaterals: Medial collateral ligament is intact. Lateral
collateral ligament complex is intact.

CARTILAGE

Patellofemoral: Full-thickness cartilage loss of the trochlear
groove with subchondral reactive marrow edema.

Medial: Mild partial-thickness cartilage loss of the medial femoral
condyle and medial tibial plateau.

Lateral:  No chondral defect.  Mild chondromalacia.

Joint: No significant joint effusion. Mild edema in Hoffa's fat. No
plical thickening.

Popliteal Fossa: Small Baker cyst. Intact popliteus tendon. Small
cystic changes adjacent to the posterior medial joint capsule likely
representing ganglion cysts.

Extensor Mechanism:  Intact.

Bones: Mild T2 hyperintense signal in the distal femoral diaphysis
and metaphysis. No acute fracture or dislocation.
IMPRESSION: 1. Small oblique tear of the posterior horn of the medial meniscus
extending to the inferior articular surface.
2. Full-thickness cartilage loss of the trochlear groove with
subchondral reactive marrow edema.
3. Mild T2 hyperintense signal in the distal femoral diaphysis and
metaphysis. Although this appearance can be seen with red marrow,
this is atypical given that there is no similar finding in the
proximal tibia. An infiltrative process cannot be excluded.
Correlate with laboratory values.
4. Small Baker cyst.

## 2017-10-24 ENCOUNTER — Telehealth: Payer: Self-pay | Admitting: Internal Medicine

## 2017-10-24 NOTE — Telephone Encounter (Signed)
Okay to transfer  

## 2017-10-24 NOTE — Telephone Encounter (Signed)
Copied from [place] [id number]. Topic: General - Other >> Oct 24, 2017  1:39 PM Cecelia Byars, NT wrote: Reason for CRM: Patient would like to [facility] from Dr Jenny Reichmann at [facility] to Dr Ethelene Hal at [place]

## 2017-10-24 NOTE — Telephone Encounter (Signed)
Ok with me 

## 2017-10-24 NOTE — Telephone Encounter (Signed)
Can you please set this appointment up for Mr. Shieh?

## 2017-10-26 NOTE — Telephone Encounter (Signed)
I called and spoke to patient. Patient aware that transfer has been okayed. Patient scheduled TOC appointment with Dr. Ethelene Hal for 11/14/17 @ 8am.

## 2017-10-30 ENCOUNTER — Other Ambulatory Visit: Payer: Self-pay | Admitting: Internal Medicine

## 2017-11-04 ENCOUNTER — Other Ambulatory Visit: Payer: Self-pay | Admitting: Family Medicine

## 2017-11-04 DIAGNOSIS — M10062 Idiopathic gout, left knee: Secondary | ICD-10-CM

## 2017-11-14 ENCOUNTER — Encounter: Payer: 59 | Admitting: Internal Medicine

## 2017-11-14 ENCOUNTER — Encounter: Payer: 59 | Admitting: Family Medicine

## 2017-11-20 ENCOUNTER — Encounter: Payer: Self-pay | Admitting: Family Medicine

## 2017-11-20 ENCOUNTER — Ambulatory Visit (INDEPENDENT_AMBULATORY_CARE_PROVIDER_SITE_OTHER): Payer: 59 | Admitting: Family Medicine

## 2017-11-20 VITALS — BP 110/70 | HR 61 | Ht 74.0 in | Wt 296.0 lb

## 2017-11-20 DIAGNOSIS — R0683 Snoring: Secondary | ICD-10-CM

## 2017-11-20 DIAGNOSIS — R739 Hyperglycemia, unspecified: Secondary | ICD-10-CM | POA: Diagnosis not present

## 2017-11-20 DIAGNOSIS — Z9189 Other specified personal risk factors, not elsewhere classified: Secondary | ICD-10-CM

## 2017-11-20 DIAGNOSIS — F324 Major depressive disorder, single episode, in partial remission: Secondary | ICD-10-CM

## 2017-11-20 DIAGNOSIS — G4700 Insomnia, unspecified: Secondary | ICD-10-CM | POA: Diagnosis not present

## 2017-11-20 DIAGNOSIS — Z125 Encounter for screening for malignant neoplasm of prostate: Secondary | ICD-10-CM | POA: Diagnosis not present

## 2017-11-20 DIAGNOSIS — Z7289 Other problems related to lifestyle: Secondary | ICD-10-CM | POA: Insufficient documentation

## 2017-11-20 DIAGNOSIS — E782 Mixed hyperlipidemia: Secondary | ICD-10-CM | POA: Diagnosis not present

## 2017-11-20 DIAGNOSIS — F1090 Alcohol use, unspecified, uncomplicated: Secondary | ICD-10-CM

## 2017-11-20 DIAGNOSIS — Z114 Encounter for screening for human immunodeficiency virus [HIV]: Secondary | ICD-10-CM

## 2017-11-20 DIAGNOSIS — M109 Gout, unspecified: Secondary | ICD-10-CM

## 2017-11-20 DIAGNOSIS — Z0001 Encounter for general adult medical examination with abnormal findings: Secondary | ICD-10-CM

## 2017-11-20 DIAGNOSIS — Z789 Other specified health status: Secondary | ICD-10-CM | POA: Diagnosis not present

## 2017-11-20 DIAGNOSIS — F411 Generalized anxiety disorder: Secondary | ICD-10-CM | POA: Diagnosis not present

## 2017-11-20 LAB — LIPID PANEL
CHOL/HDL RATIO: 5
Cholesterol: 142 mg/dL (ref 0–200)
HDL: 28.4 mg/dL — ABNORMAL LOW (ref >39.00)
NONHDL: 113.63
Triglycerides: 323 mg/dL — ABNORMAL HIGH (ref 0.0–149.0)
VLDL: 64.6 mg/dL — ABNORMAL HIGH (ref 0.0–40.0)

## 2017-11-20 LAB — COMPREHENSIVE METABOLIC PANEL
ALT: 31 U/L (ref 0–53)
AST: 28 U/L (ref 0–37)
Albumin: 4.3 g/dL (ref 3.5–5.2)
Alkaline Phosphatase: 67 U/L (ref 39–117)
BUN: 25 mg/dL — ABNORMAL HIGH (ref 6–23)
CHLORIDE: 105 meq/L (ref 96–112)
CO2: 28 meq/L (ref 19–32)
CREATININE: 1.16 mg/dL (ref 0.40–1.50)
Calcium: 9.6 mg/dL (ref 8.4–10.5)
GFR: 70.14 mL/min (ref >60.00)
GLUCOSE: 98 mg/dL (ref 70–99)
Potassium: 4 mEq/L (ref 3.5–5.1)
SODIUM: 140 meq/L (ref 135–145)
Total Bilirubin: 0.6 mg/dL (ref 0.2–1.2)
Total Protein: 7 g/dL (ref 6.0–8.3)

## 2017-11-20 LAB — URINALYSIS, ROUTINE W REFLEX MICROSCOPIC
Bilirubin Urine: NEGATIVE
HGB URINE DIPSTICK: NEGATIVE
Ketones, ur: NEGATIVE
Leukocytes, UA: NEGATIVE
NITRITE: NEGATIVE
RBC / HPF: NONE SEEN (ref 0–?)
SPECIFIC GRAVITY, URINE: 1.025 (ref 1.000–1.030)
Total Protein, Urine: NEGATIVE
URINE GLUCOSE: NEGATIVE
UROBILINOGEN UA: 0.2 (ref 0.0–1.0)
pH: 5.5 (ref 5.0–8.0)

## 2017-11-20 LAB — LDL CHOLESTEROL, DIRECT: LDL DIRECT: 81 mg/dL

## 2017-11-20 LAB — TSH: TSH: 1.69 u[IU]/mL (ref 0.35–4.50)

## 2017-11-20 LAB — CBC
HCT: 41 % (ref 39.0–52.0)
Hemoglobin: 14.1 g/dL (ref 13.0–17.0)
MCHC: 34.3 g/dL (ref 30.0–36.0)
MCV: 81.5 fl (ref 78.0–100.0)
Platelets: 205 10*3/uL (ref 150.0–400.0)
RBC: 5.03 Mil/uL (ref 4.22–5.81)
RDW: 14.6 % (ref 11.5–15.5)
WBC: 6.6 10*3/uL (ref 4.0–10.5)

## 2017-11-20 LAB — PSA: PSA: 0.41 ng/mL (ref 0.10–4.00)

## 2017-11-20 LAB — URIC ACID: Uric Acid, Serum: 8.2 mg/dL — ABNORMAL HIGH (ref 4.0–7.8)

## 2017-11-20 NOTE — Patient Instructions (Addendum)
Dyslipidemia Dyslipidemia is an imbalance of waxy, fat-like substances (lipids) in the blood. The body needs lipids in small amounts. Dyslipidemia often involves a high level of cholesterol or triglycerides, which are types of lipids. Common forms of dyslipidemia include:  High levels of bad cholesterol (LDL cholesterol). LDL is the type of cholesterol that causes fatty deposits (plaques) to build up in the blood vessels that carry blood away from your heart (arteries).  Low levels of good cholesterol (HDL cholesterol). HDL cholesterol is the type of cholesterol that protects against heart disease. High levels of HDL remove the LDL buildup from arteries.  High levels of triglycerides. Triglycerides are a fatty substance in the blood that is linked to a buildup of plaques in the arteries.  You can develop dyslipidemia because of the genes you are born with (primary dyslipidemia) or changes that occur during your life (secondary dyslipidemia), or as a side effect of certain medical treatments. What are the causes? Primary dyslipidemia is caused by changes (mutations) in genes that are passed down through families (inherited). These mutations cause several types of dyslipidemia. Mutations can result in disorders that make the body produce too much LDL cholesterol or triglycerides, or not enough HDL cholesterol. These disorders may lead to heart disease, arterial disease, or stroke at an early age. Causes of secondary dyslipidemia include certain lifestyle choices and diseases that lead to dyslipidemia, such as:  Eating a diet that is high in animal fat.  Not getting enough activity or exercise (having a sedentary lifestyle).  Having diabetes, kidney disease, liver disease, or thyroid disease.  Drinking large amounts of alcohol.  Using certain types of drugs.  What increases the risk? You may be at greater risk for dyslipidemia if you are an older man or if you are a woman who has gone through  menopause. Other risk factors include:  Having a family history of dyslipidemia.  Taking certain medicines, including birth control pills, steroids, some diuretics, beta-blockers, and some medicines forHIV.  Smoking cigarettes.  Eating a high-fat diet.  Drinking large amounts of alcohol.  Having certain medical conditions such as diabetes, polycystic ovary syndrome (PCOS), pregnancy, kidney disease, liver disease, or hypothyroidism.  Not exercising regularly.  Being overweight or obese with too much belly fat.  What are the signs or symptoms? Dyslipidemia does not usually cause any symptoms. Very high lipid levels can cause fatty bumps under the skin (xanthomas) or a white or gray ring around the black center (pupil) of the eye. Very high triglyceride levels can cause inflammation of the pancreas (pancreatitis). How is this diagnosed? Your health care provider may diagnose dyslipidemia based on a routine blood test (fasting blood test). Because most people do not have symptoms of the condition, this blood testing (lipid profile) is done on adults age 40 and older and is repeated every 5 years. This test checks:  Total cholesterol. This is a measure of the total amount of cholesterol in your blood, including LDL cholesterol, HDL cholesterol, and triglycerides. A healthy number is below 200.  LDL cholesterol. The target number for LDL cholesterol is different for each person, depending on individual risk factors. For most people, a number below 100 is healthy. Ask your health care provider what your LDL cholesterol number should be.  HDL cholesterol. An HDL level of 60 or higher is best because it helps to protect against heart disease. A number below 42 for men or below 39 for women increases the risk for heart disease.  Triglycerides. A  healthy triglyceride number is below 150.  If your lipid profile is abnormal, your health care provider may do other blood tests to get more  information about your condition. How is this treated? Treatment depends on the type of dyslipidemia that you have and your other risk factors for heart disease and stroke. Your health care provider will have a target range for your lipid levels based on this information. For many people, treatment starts with lifestyle changes, such as diet and exercise. Your health care provider may recommend that you:  Get regular exercise.  Make changes to your diet.  Quit smoking if you smoke.  If diet changes and exercise do not help you reach your goals, your health care provider may also prescribe medicine to lower lipids. The most commonly prescribed type of medicine lowers your LDL cholesterol (statin drug). If you have a high triglyceride level, your provider may prescribe another type of drug (fibrate) or an omega-3 fish oil supplement, or both. Follow these instructions at home:  Take over-the-counter and prescription medicines only as told by your health care provider. This includes supplements.  Get regular exercise. Start an aerobic exercise and strength training program as told by your health care provider. Ask your health care provider what activities are safe for you. Your health care provider may recommend: ? 30 minutes of aerobic activity 4-6 days a week. Brisk walking is an example of aerobic activity. ? Strength training 2 days a week.  Eat a healthy diet as told by your health care provider. This can help you reach and maintain a healthy weight, lower your LDL cholesterol, and raise your HDL cholesterol. It may help to work with a diet and nutrition specialist (dietitian) to make a plan that is right for you. Your dietitian or health care provider may recommend: ? Limiting your calories, if you are overweight. ? Eating more fruits, vegetables, whole grains, fish, and lean meats. ? Limiting saturated fat, trans fat, and cholesterol.  Follow instructions from your health care provider  or dietitian about eating or drinking restrictions.  Limit alcohol intake to no more than one drink per day for nonpregnant women and two drinks per day for men. One drink equals 12 oz of beer, 5 oz of wine, or 1 oz of hard liquor.  Do not use any products that contain nicotine or tobacco, such as cigarettes and e-cigarettes. If you need help quitting, ask your health care provider.  Keep all follow-up visits as told by your health care provider. This is important. Contact a health care provider if:  You are having trouble sticking to your exercise or diet plan.  You are struggling to quit smoking or control your use of alcohol. Summary  Dyslipidemia is an imbalance of waxy, fat-like substances (lipids) in the blood. The body needs lipids in small amounts. Dyslipidemia often involves a high level of cholesterol or triglycerides, which are types of lipids.  Treatment depends on the type of dyslipidemia that you have and your other risk factors for heart disease and stroke.  For many people, treatment starts with lifestyle changes, such as diet and exercise. Your health care provider may also prescribe medicine to lower lipids. This information is not intended to replace advice given to you by your health care provider. Make sure you discuss any questions you have with your health care provider. Document Released: 02/26/2013 Document Revised: 10/19/2015 Document Reviewed: 10/19/2015 Elsevier Interactive Patient Education  2018 [employer].  Exercising to [employer] Exercising  can help you to lose weight. In order to lose weight through exercise, you need to do vigorous-intensity exercise. You can tell that you are exercising with vigorous intensity if you are breathing very hard and fast and cannot hold a conversation while exercising. Moderate-intensity exercise helps to maintain your current weight. You can tell that you are exercising at a moderate level if you have a higher heart rate  and faster breathing, but you are still able to hold a conversation. How often should I exercise? Choose an activity that you enjoy and set realistic goals. Your health care provider can help you to make an activity plan that works for you. Exercise regularly as directed by your health care provider. This may include:  Doing resistance training twice each week, such as: ? Push-ups. ? Sit-ups. ? Lifting weights. ? Using resistance bands.  Doing a given intensity of exercise for a given amount of time. Choose from these options: ? 150 minutes of moderate-intensity exercise every week. ? 75 minutes of vigorous-intensity exercise every week. ? A mix of moderate-intensity and vigorous-intensity exercise every week.  Children, pregnant women, people who are out of shape, people who are overweight, and older adults may need to consult a health care provider for individual recommendations. If you have any sort of medical condition, be sure to consult your health care provider before starting a new exercise program. What are some activities that can help me to lose weight?  Walking at a rate of at least 4.5 miles an hour.  Jogging or running at a rate of 5 miles per hour.  Biking at a rate of at least 10 miles per hour.  Lap swimming.  Roller-skating or in-line skating.  Cross-country skiing.  Vigorous competitive sports, such as football, basketball, and soccer.  Jumping rope.  Aerobic dancing. How can I be more active in my day-to-day activities?  Use the stairs instead of the elevator.  Take a walk during your lunch break.  If you drive, park your car farther away from work or school.  If you take public transportation, get off one stop early and walk the rest of the way.  Make all of your phone calls while standing up and walking around.  Get up, stretch, and walk around every 30 minutes throughout the day. What guidelines should I follow while exercising?  Do not  exercise so much that you hurt yourself, feel dizzy, or get very short of breath.  Consult your health care provider prior to starting a new exercise program.  Wear comfortable clothes and shoes with good support.  Drink plenty of water while you exercise to prevent dehydration or heat stroke. Body water is lost during exercise and must be replaced.  Work out until you breathe faster and your heart beats faster. This information is not intended to replace advice given to you by your health care provider. Make sure you discuss any questions you have with your health care provider. Document Released: 03/26/2010 Document Revised: 07/30/2015 Document Reviewed: 07/25/2013 Elsevier Interactive Patient Education  2018 [employer] Maintenance, Male A healthy lifestyle and preventive care is important for your health and wellness. Ask your health care provider about what schedule of regular examinations is right for you. What should I know about weight and diet? Eat a Healthy Diet  Eat plenty of vegetables, fruits, whole grains, low-fat dairy products, and lean protein.  Do not eat a lot of foods high in solid fats, added sugars, or salt.  Maintain a Healthy Weight Regular exercise can help you achieve or maintain a healthy weight. You should:  Do at least 150 minutes of exercise each week. The exercise should increase your heart rate and make you sweat (moderate-intensity exercise).  Do strength-training exercises at least twice a week.  Watch Your Levels of Cholesterol and Blood Lipids  Have your blood tested for lipids and cholesterol every 5 years starting at 52 years of age. If you are at high risk for heart disease, you should start having your blood tested when you are 52 years old. You may need to have your cholesterol levels checked more often if: ? Your lipid or cholesterol levels are high. ? You are older than 52 years of age. ? You are at high risk for heart  disease.  What should I know about cancer screening? Many types of cancers can be detected early and may often be prevented. Lung Cancer  You should be screened every year for lung cancer if: ? You are a current smoker who has smoked for at least 30 years. ? You are a former smoker who has quit within the past 15 years.  Talk to your health care provider about your screening options, when you should start screening, and how often you should be screened.  Colorectal Cancer  Routine colorectal cancer screening usually begins at 52 years of age and should be repeated every 5-10 years until you are 52 years old. You may need to be screened more often if early forms of precancerous polyps or small growths are found. Your health care provider may recommend screening at an earlier age if you have risk factors for colon cancer.  Your health care provider may recommend using home test kits to check for hidden blood in the stool.  A small camera at the end of a tube can be used to examine your colon (sigmoidoscopy or colonoscopy). This checks for the earliest forms of colorectal cancer.  Prostate and Testicular Cancer  Depending on your age and overall health, your health care provider may do certain tests to screen for prostate and testicular cancer.  Talk to your health care provider about any symptoms or concerns you have about testicular or prostate cancer.  Skin Cancer  Check your skin from head to toe regularly.  Tell your health care provider about any new moles or changes in moles, especially if: ? There is a change in a mole's size, shape, or color. ? You have a mole that is larger than a pencil eraser.  Always use sunscreen. Apply sunscreen liberally and repeat throughout the day.  Protect yourself by wearing long sleeves, pants, a wide-brimmed hat, and sunglasses when outside.  What should I know about heart disease, diabetes, and high blood pressure?  If you are 18-39 years  of age, have your blood pressure checked every 3-5 years. If you are 6 years of age or older, have your blood pressure checked every year. You should have your blood pressure measured twice-once when you are at a hospital or clinic, and once when you are not at a hospital or clinic. Record the average of the two measurements. To check your blood pressure when you are not at a hospital or clinic, you can use: ? An automated blood pressure machine at a pharmacy. ? A home blood pressure monitor.  Talk to your health care provider about your target blood pressure.  If you are between 70-15 years old, ask your health care provider if  you should take aspirin to prevent heart disease.  Have regular diabetes screenings by checking your fasting blood sugar level. ? If you are at a normal weight and have a low risk for diabetes, have this test once every three years after the age of 54. ? If you are overweight and have a high risk for diabetes, consider being tested at a younger age or more often.  A one-time screening for abdominal aortic aneurysm (AAA) by ultrasound is recommended for men aged 23-75 years who are current or former smokers. What should I know about preventing infection? Hepatitis B If you have a higher risk for hepatitis B, you should be screened for this virus. Talk with your health care provider to find out if you are at risk for hepatitis B infection. Hepatitis C Blood testing is recommended for:  Everyone born from [birth year] through 1965.  Anyone with known risk factors for hepatitis C.  Sexually Transmitted Diseases (STDs)  You should be screened each year for STDs including gonorrhea and chlamydia if: ? You are sexually active and are younger than 52 years of age. ? You are older than 52 years of age and your health care provider tells you that you are at risk for this type of infection. ? Your sexual activity has changed since you were last screened and you are at an increased  risk for chlamydia or gonorrhea. Ask your health care provider if you are at risk.  Talk with your health care provider about whether you are at high risk of being infected with HIV. Your health care provider may recommend a prescription medicine to help prevent HIV infection.  What else can I do?  Schedule regular health, dental, and eye exams.  Stay current with your vaccines (immunizations).  Do not use any tobacco products, such as cigarettes, chewing tobacco, and e-cigarettes. If you need help quitting, ask your health care provider.  Limit alcohol intake to no more than 2 drinks per day. One drink equals 12 ounces of beer, 5 ounces of wine, or 1 ounces of hard liquor.  Do not use street drugs.  Do not share needles.  Ask your health care provider for help if you need support or information about quitting drugs.  Tell your health care provider if you often feel depressed.  Tell your health care provider if you have ever been abused or do not feel safe at home. This information is not intended to replace advice given to you by your health care provider. Make sure you discuss any questions you have with your health care provider. Document Released: 08/20/2007 Document Revised: 10/21/2015 Document Reviewed: 11/25/2014 Elsevier Interactive Patient Education  2018 [employer].  How to Increase Your Level of Physical Activity Getting regular physical activity is important for your overall health and well-being. Most people do not get enough exercise. There are easy ways to increase your level of physical activity, even if you have not been very active in the past or you are just starting out. Why is physical activity important? Physical activity has many short-term and long-term health benefits. Regular exercise can:  Help you lose weight or maintain a healthy weight.  Strengthen your muscles and bones.  Boost your mood and improve self-esteem.  Reduce your risk of certain  long-term (chronic) diseases, like heart disease, cancer, and diabetes.  Help you stay capable of walking and moving around (mobile) as you age.  Prevent accidents, such as falls, as you age.  Increase life expectancy.  What are the benefits of being physically active on a regular basis? In addition to improving your physical health, being physically active on most days of the week can help you in ways that you may not expect. Benefits of regular physical activity may include:  Feeling good about your body.  Being able to move around more easily and for longer periods of time without getting tired (increased stamina).  Finding new sources of fun and enjoyment.  Meeting new people who share a common interest.  Being able to fight off illness better (enhanced immunity).  Being able to sleep better.  What can happen if I am not physically active on a regular basis? Not getting enough physical activity can lead to an unhealthy lifestyle and future health problems. This can increase your chances of:  Becoming overweight or obese.  Becoming sick.  Developing chronic illnesses, like heart disease or diabetes.  Having mental health problems, like depression or anxiety.  Having sleep problems.  Having trouble walking or getting yourself around (reduced mobility).  Injuring yourself in a fall as you get older.  What steps can I take to be more physically active?  Check with your health care provider about how to get started. Ask your health care provider what activities are safe for you.  Start out slowly. Walking or doing some simple chair exercises is a good place to start, especially if you have not been active before or for a long time.  Try to find activities that you enjoy. You are more likely to commit to an exercise routine if it does not feel like a chore.  If you have bone or joint problems, choose low-impact exercises, like walking or swimming.  Include physical  activity in your everyday routine.  Invite friends or family members to exercise with you. This also will help you commit to your workout plan.  Set goals that you can work toward.  Aim for at least 150 minutes of moderate-intensity exercise each week. Examples of moderate-intensity exercise include walking or riding a bike. Where to find more information:  Centers for Disease Control and Prevention: [employer]  [employer] on [employer], Sports & Nutrition www.http://villegas.org/  [employer]MyPlate: [employer] Contact a health care provider if:  You have headaches, muscle aches, or joint pain.  You feel dizzy or light-headed while exercising.  You faint.  You have chest pain while exercising. Summary  Exercise benefits your mind and body at any age, even if you are just starting out.  If you have a chronic illness or have not been active for a while, check with your health care provider before increasing your physical activity.  Choose activities that are safe and enjoyable for you.Ask your health care provider what activities are safe for you.  Start slowly. Tell your health care provider if you have problems as you start to increase your activity level. This information is not intended to replace advice given to you by your health care provider. Make sure you discuss any questions you have with your health care provider. Document Released: 02/11/2016 Document Revised: 02/11/2016 Document Reviewed: 02/11/2016 Elsevier Interactive Patient Education  2018 [employer].  Obesity, Adult Obesity is the condition of having too much total body fat. Being overweight or obese means that your weight is greater than what is considered healthy for your body size. Obesity is determined by a measurement called BMI. BMI is an estimate of body fat and is calculated from height and weight. For adults, a BMI of  30 or higher is  considered obese. Obesity can eventually lead to other health concerns and major illnesses, including:  Stroke.  Coronary artery disease (CAD).  Type 2 diabetes.  Some types of cancer, including cancers of the colon, breast, uterus, and gallbladder.  Osteoarthritis.  High blood pressure (hypertension).  High cholesterol.  Sleep apnea.  Gallbladder stones.  Infertility problems.  What are the causes? The main cause of obesity is taking in (consuming) more calories than your body uses for energy. Other factors that contribute to this condition may include:  Being born with genes that make you more likely to become obese.  Having a medical condition that causes obesity. These conditions include: ? Hypothyroidism. ? Polycystic ovarian syndrome (PCOS). ? Binge-eating disorder. ? Cushing syndrome.  Taking certain medicines, such as steroids, antidepressants, and seizure medicines.  Not being physically active (sedentary lifestyle).  Living where there are limited places to exercise safely or buy healthy foods.  Not getting enough sleep.  What increases the risk? The following factors may increase your risk of this condition:  Having a family history of obesity.  Being a woman of African-American descent.  Being a man of Hispanic descent.  What are the signs or symptoms? Having excessive body fat is the main symptom of this condition. How is this diagnosed? This condition may be diagnosed based on:  Your symptoms.  Your medical history.  A physical exam. Your health care provider may measure: ? Your BMI. If you are an adult with a BMI between 25 and less than 30, you are considered overweight. If you are an adult with a BMI of 30 or higher, you are considered obese. ? The distances around your hips and your waist (circumferences). These may be compared to each other to help diagnose your condition. ? Your skinfold thickness. Your health care provider may gently  pinch a fold of your skin and measure it.  How is this treated? Treatment for this condition often includes changing your lifestyle. Treatment may include some or all of the following:  Dietary changes. Work with your health care provider and a dietitian to set a weight-loss goal that is healthy and reasonable for you. Dietary changes may include eating: ? Smaller portions. A portion size is the amount of a particular food that is healthy for you to eat at one time. This varies from person to person. ? Low-calorie or low-fat options. ? More whole grains, fruits, and vegetables.  Regular physical activity. This may include aerobic activity (cardio) and strength training.  Medicine to help you lose weight. Your health care provider may prescribe medicine if you are unable to lose 1 pound a week after 6 weeks of eating more healthily and doing more physical activity.  Surgery. Surgical options may include gastric banding and gastric bypass. Surgery may be done if: ? Other treatments have not helped to improve your condition. ? You have a BMI of 40 or higher. ? You have life-threatening health problems related to obesity.  Follow these instructions at home:  Eating and drinking   Follow recommendations from your health care provider about what you eat and drink. Your health care provider may advise you to: ? Limit fast foods, sweets, and processed snack foods. ? Choose low-fat options, such as low-fat milk instead of whole milk. ? Eat 5 or more servings of fruits or vegetables every day. ? Eat at home more often. This gives you more control over what you eat. ? Choose healthy foods  when you eat out. ? Learn what a healthy portion size is. ? Keep low-fat snacks on hand. ? Avoid sugary drinks, such as soda, fruit juice, iced tea sweetened with sugar, and flavored milk. ? Eat a healthy breakfast.  Drink enough water to keep your urine clear or pale yellow.  Do not go without eating for  long periods of time (do not fast) or follow a fad diet. Fasting and fad diets can be unhealthy and even dangerous. Physical Activity  Exercise regularly, as told by your health care provider. Ask your health care provider what types of exercise are safe for you and how often you should exercise.  Warm up and stretch before being active.  Cool down and stretch after being active.  Rest between periods of activity. Lifestyle  Limit the time that you spend in front of your TV, computer, or video game system.  Find ways to reward yourself that do not involve food.  Limit alcohol intake to no more than 1 drink a day for nonpregnant women and 2 drinks a day for men. One drink equals 12 oz of beer, 5 oz of wine, or 1 oz of hard liquor. General instructions  Keep a weight loss journal to keep track of the food you eat and how much you exercise you get.  Take over-the-counter and prescription medicines only as told by your health care provider.  Take vitamins and supplements only as told by your health care provider.  Consider joining a support group. Your health care provider may be able to recommend a support group.  Keep all follow-up visits as told by your health care provider. This is important. Contact a health care provider if:  You are unable to meet your weight loss goal after 6 weeks of dietary and lifestyle changes. This information is not intended to replace advice given to you by your health care provider. Make sure you discuss any questions you have with your health care provider. Document Released: 03/31/2004 Document Revised: 07/27/2015 Document Reviewed: 12/10/2014 Elsevier Interactive Patient Education  2018 [employer].

## 2017-11-20 NOTE — Progress Notes (Addendum)
Subjective:  Patient ID: Corey Garrett, male    DOB: [birth year]  Age: 52 y.o. MRN: [id number]  CC: Annual Exam   HPI ANDREAS SOBOLEWSKI presents for a physical exam and follow-up of his multiple medical issues including some new additional ones.  He is married and works as a Charity fundraiser at [employer].  He is active on his job but does not exercise on a regular basis.  He quit smoking some years ago but does drink alcohol.  He does not drink every day but says that he will drink up to 6 beers in a giving setting on random nights during the week.  Strong family history of alcoholism.  Past medical history of gout that is been controlled with allopurinol he says.  History of anxiety and depression with insomnia.  Prozac has helped him with this issue as well as trazodone.  He does snore loudly.  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  He does not feel rested in the morning.  History of hyperlipidemia treated with fenofibrate.  Last colonoscopy was 2 years ago.  Outpatient Medications Prior to Visit  Medication Sig Dispense Refill  . FLUoxetine (PROZAC) 40 MG capsule Take 1 capsule (40 mg total) by mouth daily. 90 capsule 3  . Multiple Vitamin (MULTIVITAMIN) tablet Take 1 tablet by mouth daily.    . traZODone (DESYREL) 50 MG tablet PLEASE SEE ATTACHED FOR DETAILED DIRECTIONS 90 tablet 0  . allopurinol (ZYLOPRIM) 100 MG tablet TAKE 1 TABLET BY MOUTH EVERY DAY 90 tablet 1  . fenofibrate 160 MG tablet Take 1 tablet (160 mg total) by mouth daily. 90 tablet 3  . indomethacin (INDOCIN) 50 MG capsule Take 1 capsule (50 mg total) by mouth 3 (three) times daily as needed. prn 270 capsule 1  . ketoconazole (NIZORAL) 2 % cream Apply 1 application topically 2 (two) times daily as needed for irritation. 15 g 1   No facility-administered medications prior to visit.     ROS Review of Systems  Constitutional: Negative.   HENT: Negative.   Respiratory:  Negative.   Cardiovascular: Negative.   Gastrointestinal: Negative.   Endocrine: Negative for polyphagia and polyuria.  Genitourinary: Negative for decreased urine volume, difficulty urinating, frequency and urgency.  Musculoskeletal: Negative for gait problem and joint swelling.  Skin: Positive for rash. Negative for pallor.  Allergic/Immunologic: Negative for immunocompromised state.  Neurological: Negative for weakness and headaches.  Hematological: Does not bruise/bleed easily.  Psychiatric/Behavioral: Positive for sleep disturbance. The patient is nervous/anxious.     Objective:  BP 110/70   Pulse 61   Ht 6\' 2"  (1.88 m)   Wt 296 lb (134.3 kg)   SpO2 95%   BMI 38.00 kg/m   BP Readings from Last 3 Encounters:  11/20/17 110/70  03/24/17 138/84  11/11/16 124/68    Wt Readings from Last 3 Encounters:  11/20/17 296 lb (134.3 kg)  03/24/17 289 lb (131.1 kg)  11/08/16 268 lb (121.6 kg)    Physical Exam  Constitutional: He is oriented to person, place, and time. He appears well-developed and well-nourished. No distress.  HENT:  Head: Normocephalic and atraumatic.  Right Ear: External ear normal.  Left Ear: External ear normal.  Nose: Nose normal.  Mouth/Throat: Oropharynx is clear and moist. No oropharyngeal exudate.  Eyes: Pupils are equal, round, and reactive to light. Conjunctivae and EOM are normal. Right eye exhibits no discharge. Left eye exhibits no discharge. No scleral  icterus.  Neck: No JVD present. No tracheal deviation present. No thyromegaly present.  Cardiovascular: Normal rate, regular rhythm and normal heart sounds.  Pulmonary/Chest: Effort normal and breath sounds normal.  Abdominal: Soft. Bowel sounds are normal. He exhibits distension. There is no tenderness. There is no guarding.  Genitourinary: Rectal exam shows external hemorrhoid. Rectal exam shows no internal hemorrhoid, no fissure, no mass, no tenderness, anal tone normal and guaiac negative stool.  Prostate is not enlarged and not tender.  Lymphadenopathy:    He has no cervical adenopathy.  Neurological: He is alert and oriented to person, place, and time.  Skin: Skin is warm. He is not diaphoretic.  Psychiatric: He has a normal mood and affect. His behavior is normal.    Lab Results  Component Value Date   WBC 6.6 11/20/2017   HGB 14.1 11/20/2017   HCT 41.0 11/20/2017   PLT 205.0 11/20/2017   GLUCOSE 98 11/20/2017   CHOL 142 11/20/2017   TRIG 323.0 (H) 11/20/2017   HDL 28.40 (L) 11/20/2017   LDLDIRECT 81.0 11/20/2017   LDLCALC 89 11/11/2016   ALT 31 11/20/2017   AST 28 11/20/2017   NA 140 11/20/2017   K 4.0 11/20/2017   CL 105 11/20/2017   CREATININE 1.16 11/20/2017   BUN 25 (H) 11/20/2017   CO2 28 11/20/2017   TSH 1.69 11/20/2017   PSA 0.41 11/20/2017   HGBA1C 5.2 11/11/2016    Dg Knee Complete 4 Views Right  Result Date: 11/08/2016 CLINICAL DATA:  Chronic RIGHT knee pain, fluid drawn off today, no known injury EXAM: RIGHT KNEE - COMPLETE 4+ VIEW COMPARISON:  None FINDINGS: Osseous mineralization normal. Joint spaces preserved. No acute fracture, dislocation, or bone destruction. Small amount of air is identified at the suprapatellar recess consistent with history of preceding arthrocentesis. IMPRESSION: No acute osseous abnormalities. Electronically Signed   By: Lavonia Dana M.D.   On: 11/08/2016 16:03    Assessment & Plan:   [place] was seen today for annual exam.  Diagnoses and all orders for this visit:  Gout, unspecified cause, unspecified chronicity, unspecified site -     CBC -     Comprehensive metabolic panel -     Uric acid -     Urinalysis, Routine w reflex microscopic -     allopurinol (ZYLOPRIM) 300 MG tablet; Take 1 tablet (300 mg total) by mouth daily.  Anxiety state  Depression, major, single episode, in partial remission (HCC)  Insomnia, unspecified type  Snoring -     Ambulatory referral to Sleep Studies  Alcohol use -      Comprehensive metabolic panel  Hyperglycemia -     Comprehensive metabolic panel -     TSH  Mixed hyperlipidemia -     CBC -     Comprehensive metabolic panel -     Lipid panel -     TSH -     fenofibrate micronized (LOFIBRA) 200 MG capsule; Take 1 capsule (200 mg total) by mouth daily before breakfast.  Screening for HIV (human immunodeficiency virus) -     HIV Antibody (routine testing w rflx)  Screening for prostate cancer -     PSA  Morbid obesity ([place])  Encounter for health maintenance examination with abnormal findings  Other orders -     LDL cholesterol, direct   I have discontinued Legrand Como T. Jeane's indomethacin, fenofibrate, ketoconazole, and allopurinol. I am also having him start on allopurinol and fenofibrate micronized. Additionally, I am having  him maintain his multivitamin, FLUoxetine, and traZODone.  Meds ordered this encounter  Medications  . allopurinol (ZYLOPRIM) 300 MG tablet    Sig: Take 1 tablet (300 mg total) by mouth daily.    Dispense:  90 tablet    Refill:  1  . fenofibrate micronized (LOFIBRA) 200 MG capsule    Sig: Take 1 capsule (200 mg total) by mouth daily before breakfast.    Dispense:  90 capsule    Refill:  1   Patient was given anticipatory guidance and health promotion and disease prevention with regards to dyslipidemia and health maintenance.  He was encouraged also to start an nonweightbearing exercise program of 30 minutes at least 5 days a week.  He was given information on obesity.  We discussed his alcohol usage is being at risk and that he would be subject to a DUI if he were ever to be pulled over and checked.  He has a strong family history of alcoholism.  He tells me that he is more than aware of this issue.  Addendum: Triglycerides and her gas is trying to be elevated.  Patient has been compliant with his previous doses of fenofibrate and allopurinol.  Will increase doses and follow-up in 3 months.  Follow-up: Return in  about 3 months (around 02/19/2018), or if symptoms worsen or fail to improve.  Libby Maw, MD

## 2017-11-21 LAB — HIV ANTIBODY (ROUTINE TESTING W REFLEX): HIV 1&2 Ab, 4th Generation: NONREACTIVE

## 2017-11-21 MED ORDER — FENOFIBRATE MICRONIZED 200 MG PO CAPS: 200.0000 mg | ORAL_CAPSULE | Freq: Every day | ORAL | 1 refills | Status: DC

## 2017-11-21 MED ORDER — ALLOPURINOL 300 MG PO TABS: 300.0000 mg | ORAL_TABLET | Freq: Every day | ORAL | 1 refills | Status: DC

## 2017-11-21 NOTE — Telephone Encounter (Signed)
See result note.  

## 2017-11-21 NOTE — Addendum Note (Signed)
Addended by: Jon Billings on: 11/21/2017 04:12 PM   Modules accepted: Orders, Level of Service

## 2017-11-21 NOTE — Telephone Encounter (Signed)
Pt states he has been taking both medications on a regular basis. Pt states he is a bit concerned with the levels. Pt can be reached at [phone]

## 2017-12-18 ENCOUNTER — Other Ambulatory Visit: Payer: Self-pay | Admitting: Internal Medicine

## 2017-12-18 DIAGNOSIS — E781 Pure hyperglyceridemia: Secondary | ICD-10-CM

## 2018-01-16 ENCOUNTER — Encounter: Payer: Self-pay | Admitting: Neurology

## 2018-01-16 ENCOUNTER — Ambulatory Visit: Payer: 59 | Admitting: Neurology

## 2018-01-16 VITALS — BP 132/78 | HR 69 | Ht 74.0 in | Wt 293.0 lb

## 2018-01-16 DIAGNOSIS — G4719 Other hypersomnia: Secondary | ICD-10-CM | POA: Diagnosis not present

## 2018-01-16 DIAGNOSIS — F458 Other somatoform disorders: Secondary | ICD-10-CM | POA: Diagnosis not present

## 2018-01-16 DIAGNOSIS — R0683 Snoring: Secondary | ICD-10-CM

## 2018-01-16 DIAGNOSIS — E669 Obesity, unspecified: Secondary | ICD-10-CM

## 2018-01-16 DIAGNOSIS — G479 Sleep disorder, unspecified: Secondary | ICD-10-CM

## 2018-01-16 NOTE — Patient Instructions (Signed)

## 2018-01-16 NOTE — Progress Notes (Signed)
Subjective:    Patient ID: Corey Garrett is a 52 y.o. male.  HPI     [name], MD, PhD [facility] [address] [address] [place], [place] [address]  Dear Dr. Ethelene Hal,   I saw your patient, Corey Garrett, upon your kind request in my sleep clinic today for initial consultation of his sleep disorder, in particular, concern for underlying obstructive sleep apnea. The patient is unaccompanied today. As you know, Corey Garrett is a 52 year old right-handed gentleman with an underlying medical history of anxiety, depression, gout, hyperlipidemia, and obesity, who reports snoring and excessive daytime somnolence. I reviewed your office note from 11/20/2017. He has considered jaw surgery for bruxism but was encouraged to have a sleep study first. He is married and lives with his family including wife and 2 children, he has grown children, son is 93 and daughter is 48 with special needs. He is a nonsmoker and drinks alcohol in the form of beer, 5 or 6 per week on average, he drinks caffeine, one serving per day on average. His Epworth sleepiness score is 13 out of 24 today, fatigue score is 41 out of 63.  He has difficulty maintaining sleep, has stress, long commute (2 hours to [place]), but often stays in [place] during the workweek. He has been taking trazodone 50 mg nightly and also on Melatonin. He has anxiety, for which he has been on Prozac for the past 2 years, increased to 40 mg in the recent past, which has been very helpful. He has no known FHx of OSA. He snores loudly, therefore typically he sleeps in a separate bedroom from his wife. Generally, he tries to be in bed around 10:30, takes his trazodone and melatonin typically between 9:30 and 10:30. He has trouble falling asleep, he typically watches something on his TV size computer screen on YouTube. He has difficulty maintaining sleep despite taking medication. He does not have nighttime nocturia or morning  headaches. He has to get up around 4:15 AM if he drives to [facility], if he is there, he will get up around 6 but often is awake before rise time. He denies restless legs type symptoms but is a very restless sleeper. He has had vivid dreams in the past including some acting out in his dreams. He has had a long history of bruxism and has seen a oral surgeon recently. He may need jaw surgery. He has had significant weight gain in the recent past. He works as a Garment/textile technologist.  His Past Medical History Is Significant For: Past Medical History:  Diagnosis Date  . ANXIETY 10/07/2006  . Basal cell carcinoma    right shoulder blade  . COLONIC POLYPS, HX OF 07/10/2007  . DEPRESSION 12/29/2008  . GOUT 07/10/2007  . HEMORRHOIDS, HX OF 10/07/2006  . Hyperuricemia 04/03/2013  . Skin tag of anus     His Past Surgical History Is Significant For: No past surgical history on file.  His Family History Is Significant For: Family History  Problem Relation Age of Onset  . Emphysema Father   . Autism Daughter        epilepsy  . Anxiety disorder Other     His Social History Is Significant For: Social History   Socioeconomic History  . Marital status: Married    Spouse name: Not on file  . Number of children: Not on file  . Years of education: Not on file  . Highest education level: Not on file  Occupational History  . 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 Financial resource strain: Not on file  . Food insecurity:    Worry: Not on file    Inability: Not on file  . Transportation needs:    Medical: Not on file    Non-medical: Not on file  Tobacco Use  . Smoking status: Former Smoker    Types: Cigars  . Smokeless tobacco: Never Used  . Tobacco comment: Occasional cigar.  Substance and Sexual Activity  . Alcohol use: Yes    Alcohol/week: 4.0 - 6.0 standard drinks    Types: 4 - 6 Standard drinks or equivalent per week  . Drug use: No  . Sexual activity: Not on file  Lifestyle   . Physical activity:    Days per week: Not on file    Minutes per session: Not on file  . Stress: Not on file  Relationships  . Social connections:    Talks on phone: Not on file    Gets together: Not on file    Attends religious service: Not on file    Active member of club or organization: Not on file    Attends meetings of clubs or organizations: Not on file    Relationship status: Not on file  Other Topics Concern  . Not on file  Social History Narrative  . Not on file    His Allergies Are:  No Known Allergies:   His Current Medications Are:  Outpatient Encounter Medications as of 01/16/2018  Medication Sig  . allopurinol (ZYLOPRIM) 300 MG tablet Take 1 tablet (300 mg total) by mouth daily.  . fenofibrate micronized (LOFIBRA) 200 MG capsule Take 1 capsule (200 mg total) by mouth daily before breakfast.  . FLUoxetine (PROZAC) 40 MG capsule Take 1 capsule (40 mg total) by mouth daily.  . Multiple Vitamin (MULTIVITAMIN) tablet Take 1 tablet by mouth daily.  . traZODone (DESYREL) 50 MG tablet PLEASE SEE ATTACHED FOR DETAILED DIRECTIONS  . [DISCONTINUED] fenofibrate 160 MG tablet TAKE 1 TABLET BY MOUTH EVERY DAY   No facility-administered encounter medications on file as of 01/16/2018.   :  Review of Systems:  Out of a complete 14 point review of systems, all are reviewed and negative with the exception of these symptoms as listed below: Review of Systems  Neurological:       Pt presents today to discuss his sleep. Pt is considering jaw surgery to help with bruxism and his sleep but needs to have a sleep study first. Pt does endorse snoring.  Epworth Sleepiness Scale 0= would never doze 1= slight chance of dozing 2= moderate chance of dozing 3= high chance of dozing  Sitting and reading: 1 Watching TV: 3 Sitting inactive in a public place (ex. Theater or meeting): 3 As a passenger in a car for an hour without a break: 3 Lying down to rest in the afternoon:  3 Sitting and talking to someone: 0 Sitting quietly after lunch (no alcohol): 0 In a car, while stopped in traffic: 0 Total: 13     Objective:  Neurological Exam  Physical Exam Physical Examination:   Vitals:   01/16/18 0857  BP: 132/78  Pulse: 69   General Examination: The patient is a very pleasant 52 y.o. male in no acute distress. He appears well-developed and well-nourished and well groomed.   HEENT: Normocephalic, atraumatic, pupils are equal, round and reactive to light and accommodation. Extraocular tracking is good without limitation to gaze excursion  or nystagmus noted. Normal smooth pursuit is noted. Hearing is grossly intact. Face is symmetric with normal facial animation and normal facial sensation. Speech is clear with no dysarthria noted. There is no hypophonia. There is no lip, neck/head, jaw or voice tremor. Neck is supple with full range of passive and active motion. There are no carotid bruits on auscultation. Oropharynx exam reveals: moderate mouth dryness, adequate dental hygiene and significant wearing off teeth/dental enamel, moderate airway crowding, due to smaller airway entry and larger tongue. He has a smaller appearing uvula and tonsils are also on the smaller side, around 1+ bilaterally. Mallampati is class III. Neck circumference is 19-1/2 inches, he has a mild overbite.   Chest: Clear to auscultation without wheezing, rhonchi or crackles noted.  Heart: S1+S2+0, regular and normal without murmurs, rubs or gallops noted.   Abdomen: Soft, non-tender and non-distended with normal bowel sounds appreciated on auscultation.  Extremities: There is no pitting edema in the distal lower extremities bilaterally.  Skin: Warm and dry without trophic changes noted.  Musculoskeletal: exam reveals no obvious joint deformities, tenderness or joint swelling or erythema.   Neurologically:  Mental status: The patient is awake, alert and oriented in all 4 spheres. His  immediate and remote memory, attention, language skills and fund of knowledge are appropriate. There is no evidence of aphasia, agnosia, apraxia or anomia. Speech is clear with normal prosody and enunciation. Thought process is linear. Mood is normal and affect is normal.  Cranial nerves II - XII are as described above under HEENT exam. In addition: shoulder shrug is normal with equal shoulder height noted. Motor exam: Normal bulk, strength and tone is noted. There is no tremor. Romberg is negative. Fine motor skills and coordination: intact with normal finger taps, normal hand movements, normal rapid alternating patting, normal foot taps and normal foot agility.  Cerebellar testing: No dysmetria or intention tremor on finger to nose testing. Heel to shin is unremarkable bilaterally. There is no truncal or gait ataxia.  Sensory exam: intact to light touch in the upper and lower extremities.  Gait, station and balance: He stands easily. No veering to one side is noted. No leaning to one side is noted. Posture is age-appropriate and stance is narrow based. Gait shows normal stride length and normal pace. No problems turning are noted. Tandem walk is unremarkable.  Assessment and Plan:  In summary, Corey Garrett is a very pleasant 52 y.o.-year old male with an underlying medical history of anxiety, depression, gout, hyperlipidemia, and obesity, whose history and physical exam are concerning for obstructive sleep apnea (OSA). I had a long chat with the patient about my findings and the diagnosis of OSA, its prognosis and treatment options. We talked about medical treatments, surgical interventions and non-pharmacological approaches. I explained in particular the risks and ramifications of untreated moderate to severe OSA, especially with respect to developing cardiovascular disease down the Road, including congestive heart failure, difficult to treat hypertension, cardiac arrhythmias, or stroke. Even type 2  diabetes has, in part, been linked to untreated OSA. Symptoms of untreated OSA include daytime sleepiness, memory problems, mood irritability and mood disorder such as depression and anxiety, lack of energy, as well as recurrent headaches, especially morning headaches. We talked about trying to maintain a healthy lifestyle in general, as well as the importance of weight control. He was cautioned about his alcohol consumption, especially in light of a FHx of alcoholism. I encouraged the patient to eat healthy, exercise daily and keep  well hydrated, to keep a scheduled bedtime and wake time routine, to not skip any meals and eat healthy snacks in between meals. I advised the patient not to drive when feeling sleepy. I recommended the following at this time: sleep study with potential positive airway pressure titration. (We will score hypopneas at 4%).   I explained the sleep test procedure to the patient and also outlined possible surgical and non-surgical treatment options of OSA, including the use of a custom-made dental device (which would require a referral to a specialist dentist or oral surgeon), upper airway surgical options, such as pillar implants, radiofrequency surgery, tongue base surgery, and UPPP (which would involve a referral to an ENT surgeon). Rarely, jaw surgery such as mandibular advancement may be considered.  I also explained the CPAP treatment option to the patient, who indicated that he would be willing to try CPAP if the need arises. I explained the importance of being compliant with PAP treatment, not only for insurance purposes but primarily to improve His symptoms, and for the patient's long term health benefit, including to reduce His cardiovascular risks. I answered all his questions today and the patient was in agreement. I would like to see him back after the sleep study is completed and encouraged him to call with any interim questions, concerns, problems or updates.   Thank you  very much for allowing me to participate in the care of this nice patient. If I can be of any further assistance to you please do not hesitate to call me at [phone].  Sincerely,   [name], MD, PhD

## 2018-02-01 ENCOUNTER — Other Ambulatory Visit: Payer: Self-pay | Admitting: Internal Medicine

## 2018-04-17 ENCOUNTER — Telehealth: Payer: Self-pay | Admitting: Neurology

## 2018-04-17 NOTE — Telephone Encounter (Signed)
We have attempted to call the patient 2 times to schedule sleep study. Patient has been unavailable at the phone numbers we have on file and has not returned our calls. At this point we will send a letter asking pt to please contact the sleep lab to schedule their sleep study. If patient calls back we will schedule them for their sleep study. ° °

## 2018-05-22 ENCOUNTER — Ambulatory Visit: Payer: 59 | Admitting: Family Medicine

## 2018-05-22 ENCOUNTER — Encounter: Payer: Self-pay | Admitting: Family Medicine

## 2018-05-22 ENCOUNTER — Other Ambulatory Visit: Payer: Self-pay

## 2018-05-22 VITALS — BP 110/72 | HR 64 | Temp 98.5°F | Ht 74.0 in | Wt 300.5 lb

## 2018-05-22 DIAGNOSIS — E782 Mixed hyperlipidemia: Secondary | ICD-10-CM

## 2018-05-22 DIAGNOSIS — E790 Hyperuricemia without signs of inflammatory arthritis and tophaceous disease: Secondary | ICD-10-CM

## 2018-05-22 DIAGNOSIS — G4700 Insomnia, unspecified: Secondary | ICD-10-CM | POA: Diagnosis not present

## 2018-05-22 DIAGNOSIS — M109 Gout, unspecified: Secondary | ICD-10-CM

## 2018-05-22 DIAGNOSIS — F411 Generalized anxiety disorder: Secondary | ICD-10-CM

## 2018-05-22 LAB — CBC
HEMATOCRIT: 42.7 % (ref 39.0–52.0)
HEMOGLOBIN: 14.6 g/dL (ref 13.0–17.0)
MCHC: 34.2 g/dL (ref 30.0–36.0)
MCV: 82.8 fl (ref 78.0–100.0)
PLATELETS: 221 10*3/uL (ref 150.0–400.0)
RBC: 5.15 Mil/uL (ref 4.22–5.81)
RDW: 14.7 % (ref 11.5–15.5)
WBC: 8 10*3/uL (ref 4.0–10.5)

## 2018-05-22 LAB — COMPREHENSIVE METABOLIC PANEL
ALBUMIN: 4.3 g/dL (ref 3.5–5.2)
ALT: 33 U/L (ref 0–53)
AST: 25 U/L (ref 0–37)
Alkaline Phosphatase: 68 U/L (ref 39–117)
BUN: 24 mg/dL — ABNORMAL HIGH (ref 6–23)
CALCIUM: 9.8 mg/dL (ref 8.4–10.5)
CHLORIDE: 104 meq/L (ref 96–112)
CO2: 30 mEq/L (ref 19–32)
Creatinine, Ser: 1.13 mg/dL (ref 0.40–1.50)
GFR: 67.89 mL/min (ref >60.00)
Glucose, Bld: 114 mg/dL — ABNORMAL HIGH (ref 70–99)
POTASSIUM: 4.1 meq/L (ref 3.5–5.1)
Sodium: 140 mEq/L (ref 135–145)
Total Bilirubin: 0.4 mg/dL (ref 0.2–1.2)
Total Protein: 7.3 g/dL (ref 6.0–8.3)

## 2018-05-22 LAB — URIC ACID: Uric Acid, Serum: 7 mg/dL (ref 4.0–7.8)

## 2018-05-22 LAB — LIPID PANEL
CHOLESTEROL: 165 mg/dL (ref 0–200)
HDL: 30.3 mg/dL — ABNORMAL LOW (ref >39.00)
NonHDL: 134.89
Total CHOL/HDL Ratio: 5
Triglycerides: 292 mg/dL — ABNORMAL HIGH (ref 0.0–149.0)
VLDL: 58.4 mg/dL — ABNORMAL HIGH (ref 0.0–40.0)

## 2018-05-22 LAB — LDL CHOLESTEROL, DIRECT: Direct LDL: 95 mg/dL

## 2018-05-22 MED ORDER — ALLOPURINOL 300 MG PO TABS: 300.0000 mg | ORAL_TABLET | Freq: Every day | ORAL | 1 refills | Status: DC

## 2018-05-22 MED ORDER — FLUOXETINE HCL 40 MG PO CAPS: ORAL_CAPSULE | ORAL | 1 refills | Status: DC

## 2018-05-22 MED ORDER — TRAZODONE HCL 50 MG PO TABS: 50.0000 mg | ORAL_TABLET | Freq: Every evening | ORAL | 1 refills | Status: DC | PRN

## 2018-05-22 MED ORDER — FENOFIBRATE MICRONIZED 200 MG PO CAPS: 200.0000 mg | ORAL_CAPSULE | Freq: Every day | ORAL | 1 refills | Status: DC

## 2018-05-22 NOTE — Patient Instructions (Signed)
Dyslipidemia Dyslipidemia is an imbalance of waxy, fat-like substances (lipids) in the blood. The body needs lipids in small amounts. Dyslipidemia often involves a high level of cholesterol or triglycerides, which are types of lipids. Common forms of dyslipidemia include:  High levels of LDL cholesterol. LDL is the type of cholesterol that causes fatty deposits (plaques) to build up in the blood vessels that carry blood away from your heart (arteries).  Low levels of HDL cholesterol. HDL cholesterol is the type of cholesterol that protects against heart disease. High levels of HDL remove the LDL buildup from arteries.  High levels of triglycerides. Triglycerides are a fatty substance in the blood that is linked to a buildup of plaques in the arteries. What are the causes? Primary dyslipidemia is caused by changes (mutations) in genes that are passed down through families (inherited). These mutations cause several types of dyslipidemia. Secondary dyslipidemia is caused by lifestyle choices and diseases that lead to dyslipidemia, such as:  Eating a diet that is high in animal fat.  Not getting enough exercise.  Having diabetes, kidney disease, liver disease, or thyroid disease.  Drinking large amounts of alcohol.  Using certain medicines. What increases the risk? You are more likely to develop this condition if you are an older man or if you are a woman who has gone through menopause. Other risk factors include:  Having a family history of dyslipidemia.  Taking certain medicines, including birth control pills, steroids, some diuretics, and beta-blockers.  Smoking cigarettes.  Eating a high-fat diet.  Having certain medical conditions such as diabetes, polycystic ovary syndrome (PCOS), kidney disease, liver disease, or hypothyroidism.  Not exercising regularly.  Being overweight or obese with too much belly fat. What are the signs or symptoms? In most cases, dyslipidemia does not  usually cause any symptoms. In severe cases, very high lipid levels can cause:  Fatty bumps under the skin (xanthomas).  White or gray ring around the black center (pupil) of the eye. Very high triglyceride levels can cause inflammation of the pancreas (pancreatitis). How is this diagnosed? Your health care provider may diagnose dyslipidemia based on a routine blood test (fasting blood test). Because most people do not have symptoms of the condition, this blood testing (lipid profile) is done on adults age 20 and older and is repeated every 5 years. This test checks:  Total cholesterol. This measures the total amount of cholesterol in your blood, including LDL cholesterol, HDL cholesterol, and triglycerides. A healthy number is below 200.  LDL cholesterol. The target number for LDL cholesterol is different for each person, depending on individual risk factors. Ask your health care provider what your LDL cholesterol should be.  HDL cholesterol. An HDL level of 60 or higher is best because it helps to protect against heart disease. A number below 40 for men or below 50 for women increases the risk for heart disease.  Triglycerides. A healthy triglyceride number is below 150. If your lipid profile is abnormal, your health care provider may do other blood tests. How is this treated? Treatment depends on the type of dyslipidemia that you have and your other risk factors for heart disease and stroke. Your health care provider will have a target range for your lipid levels based on this information. For many people, this condition may be treated by lifestyle changes, such as diet and exercise. Your health care provider may recommend that you:  Get regular exercise.  Make changes to your diet.  Quit smoking if you   smoke. If diet changes and exercise do not help you reach your goals, your health care provider may also prescribe medicine to lower lipids. The most commonly prescribed type of medicine  lowers your LDL cholesterol (statin drug). If you have a high triglyceride level, your provider may prescribe another type of drug (fibrate) or an omega-3 fish oil supplement, or both. Follow these instructions at home:  Eating and drinking  Follow instructions from your health care provider or dietitian about eating or drinking restrictions.  Eat a healthy diet as told by your health care provider. This can help you reach and maintain a healthy weight, lower your LDL cholesterol, and raise your HDL cholesterol. This may include: ? Limiting your calories, if you are overweight. ? Eating more fruits, vegetables, whole grains, fish, and lean meats. ? Limiting saturated fat, trans fat, and cholesterol.  If you drink alcohol: ? Limit how much you use. ? Be aware of how much alcohol is in your drink. In the [place] one drink equals one 12 oz bottle of beer (355 mL), one 5 oz glass of wine (148 mL), or one 1 oz glass of hard liquor (44 mL).  Do not drink alcohol if: ? Your health care provider tells you not to drink. ? You are pregnant, may be pregnant, or are planning to become pregnant. Activity  Get regular exercise. Start an exercise and strength training program as told by your health care provider. Ask your health care provider what activities are safe for you. Your health care provider may recommend: ? 30 minutes of aerobic activity 4-6 days a week. Brisk walking is an example of aerobic activity. ? Strength training 2 days a week. General instructions  Do not use any products that contain nicotine or tobacco, such as cigarettes, e-cigarettes, and chewing tobacco. If you need help quitting, ask your health care provider.  Take over-the-counter and prescription medicines only as told by your health care provider. This includes supplements.  Keep all follow-up visits as told by your health care provider. Contact a health care provider if:  You are: ? Having trouble sticking to your  exercise or diet plan. ? Struggling to quit smoking or control your use of alcohol. Summary  Dyslipidemia often involves a high level of cholesterol or triglycerides, which are types of lipids.  Treatment depends on the type of dyslipidemia that you have and your other risk factors for heart disease and stroke.  For many people, treatment starts with lifestyle changes, such as diet and exercise.  Your health care provider may prescribe medicine to lower lipids. This information is not intended to replace advice given to you by your health care provider. Make sure you discuss any questions you have with your health care provider. Document Released: 02/26/2013 Document Revised: 10/16/2017 Document Reviewed: 09/22/2017 Elsevier Interactive Patient Education  2019 [place].  Gout  Gout is a condition that causes painful swelling of the joints. Gout is a type of inflammation of the joints (arthritis). This condition is caused by having too much uric acid in the body. Uric acid is a chemical that forms when the body breaks down substances called purines. Purines are important for building body proteins. When the body has too much uric acid, sharp crystals can form and build up inside the joints. This causes pain and swelling. Gout attacks can happen quickly and may be very painful (acute gout). Over time, the attacks can affect more joints and become more frequent (chronic gout). Gout can  also cause uric acid to build up under the skin and inside the kidneys. What are the causes? This condition is caused by too much uric acid in your blood. This can happen because:  Your kidneys do not remove enough uric acid from your blood. This is the most common cause.  Your body makes too much uric acid. This can happen with some cancers and cancer treatments. It can also occur if your body is breaking down too many red blood cells (hemolytic anemia).  You eat too many foods that are high in purines.  These foods include organ meats and some seafood. Alcohol, especially beer, is also high in purines. A gout attack may be triggered by trauma or stress. What increases the risk? You are more likely to develop this condition if you:  Have a family history of gout.  Are male and middle-aged.  Are male and have gone through menopause.  Are obese.  Frequently drink alcohol, especially beer.  Are dehydrated.  Lose weight too quickly.  Have an organ transplant.  Have lead poisoning.  Take certain medicines, including aspirin, cyclosporine, diuretics, levodopa, and niacin.  Have kidney disease.  Have a skin condition called psoriasis. What are the signs or symptoms? An attack of acute gout happens quickly. It usually occurs in just one joint. The most common place is the big toe. Attacks often start at night. Other joints that may be affected include joints of the feet, ankle, knee, fingers, wrist, or elbow. Symptoms of this condition may include:  Severe pain.  Warmth.  Swelling.  Stiffness.  Tenderness. The affected joint may be very painful to touch.  Shiny, red, or purple skin.  Chills and fever. Chronic gout may cause symptoms more frequently. More joints may be involved. You may also have white or yellow lumps (tophi) on your hands or feet or in other areas near your joints. How is this diagnosed? This condition is diagnosed based on your symptoms, medical history, and physical exam. You may have tests, such as:  Blood tests to measure uric acid levels.  Removal of joint fluid with a thin needle (aspiration) to look for uric acid crystals.  X-rays to look for joint damage. How is this treated? Treatment for this condition has two phases: treating an acute attack and preventing future attacks. Acute gout treatment may include medicines to reduce pain and swelling, including:  NSAIDs.  Steroids. These are strong anti-inflammatory medicines that can be taken by  mouth (orally) or injected into a joint.  Colchicine. This medicine relieves pain and swelling when it is taken soon after an attack. It can be given by mouth or through an IV. Preventive treatment may include:  Daily use of smaller doses of NSAIDs or colchicine.  Use of a medicine that reduces uric acid levels in your blood.  Changes to your diet. You may need to see a dietitian about what to eat and drink to prevent gout. Follow these instructions at home: During a gout attack   If directed, put ice on the affected area: ? Put ice in a plastic bag. ? Place a towel between your skin and the bag. ? Leave the ice on for 20 minutes, 2-3 times a day.  Raise (elevate) the affected joint above the level of your heart as often as possible.  Rest the joint as much as possible. If the affected joint is in your leg, you may be given crutches to use.  Follow instructions from your health care  provider about eating or drinking restrictions. Avoiding future gout attacks  Follow a low-purine diet as told by your dietitian or health care provider. Avoid foods and drinks that are high in purines, including liver, kidney, anchovies, asparagus, herring, mushrooms, mussels, and beer.  Maintain a healthy weight or lose weight if you are overweight. If you want to lose weight, talk with your health care provider. It is important that you do not lose weight too quickly.  Start or maintain an exercise program as told by your health care provider. Eating and drinking  Drink enough fluids to keep your urine pale yellow.  If you drink alcohol: ? Limit how much you use to:  0-1 drink a day for women.  0-2 drinks a day for men. ? Be aware of how much alcohol is in your drink. In the [place] one drink equals one 12 oz bottle of beer (355 mL) one 5 oz glass of wine (148 mL), or one 1 oz glass of hard liquor (44 mL). General instructions  Take over-the-counter and prescription medicines only as told by  your health care provider.  Do not drive or use heavy machinery while taking prescription pain medicine.  Return to your normal activities as told by your health care provider. Ask your health care provider what activities are safe for you.  Keep all follow-up visits as told by your health care provider. This is important. Contact a health care provider if you have:  Another gout attack.  Continuing symptoms of a gout attack after 10 days of treatment.  Side effects from your medicines.  Chills or a fever.  Burning pain when you urinate.  Pain in your lower back or belly. Get help right away if you:  Have severe or uncontrolled pain.  Cannot urinate. Summary  Gout is painful swelling of the joints caused by inflammation.  The most common site of pain is the big toe, but it can affect other joints in the body.  Medicines and dietary changes can help to prevent and treat gout attacks. This information is not intended to replace advice given to you by your health care provider. Make sure you discuss any questions you have with your health care provider. Document Released: 02/19/2000 Document Revised: 09/13/2017 Document Reviewed: 09/13/2017 Elsevier Interactive Patient Education  2019 [place] refers to food and lifestyle choices that are based on the traditions of countries located on the [employer]. This way of eating has been shown to help prevent certain conditions and improve outcomes for people who have chronic diseases, like kidney disease and heart disease. What are tips for following this plan? Lifestyle  Cook and eat meals together with your family, when possible.  Drink enough fluid to keep your urine clear or pale yellow.  Be physically active every day. This includes: ? Aerobic exercise like running or swimming. ? Leisure activities like gardening, walking, or housework.  Get 7-8 hours of sleep each  night.  If recommended by your health care provider, drink red wine in moderation. This means 1 glass a day for nonpregnant women and 2 glasses a day for men. A glass of wine equals 5 oz (150 mL). Reading food labels   Check the serving size of packaged foods. For foods such as rice and pasta, the serving size refers to the amount of cooked product, not dry.  Check the total fat in packaged foods. Avoid foods that have saturated fat or trans fats.  Check the ingredients list for added sugars, such as corn syrup. Shopping  At the grocery store, buy most of your food from the areas near the walls of the store. This includes: ? Fresh fruits and vegetables (produce). ? Grains, beans, nuts, and seeds. Some of these may be available in unpackaged forms or large amounts (in bulk). ? Fresh seafood. ? Poultry and eggs. ? Low-fat dairy products.  Buy whole ingredients instead of prepackaged foods.  Buy fresh fruits and vegetables in-season from local farmers markets.  Buy frozen fruits and vegetables in resealable bags.  If you do not have access to quality fresh seafood, buy precooked frozen shrimp or canned fish, such as tuna, salmon, or sardines.  Buy small amounts of raw or cooked vegetables, salads, or olives from the deli or salad bar at your store.  Stock your pantry so you always have certain foods on hand, such as olive oil, canned tuna, canned tomatoes, rice, pasta, and beans. Cooking  Cook foods with extra-virgin olive oil instead of using butter or other vegetable oils.  Have meat as a side dish, and have vegetables or grains as your main dish. This means having meat in small portions or adding small amounts of meat to foods like pasta or stew.  Use beans or vegetables instead of meat in common dishes like chili or lasagna.  Experiment with different cooking methods. Try roasting or broiling vegetables instead of steaming or sauteing them.  Add frozen vegetables to soups,  stews, pasta, or rice.  Add nuts or seeds for added healthy fat at each meal. You can add these to yogurt, salads, or vegetable dishes.  Marinate fish or vegetables using olive oil, lemon juice, garlic, and fresh herbs. Meal planning   Plan to eat 1 vegetarian meal one day each week. Try to work up to 2 vegetarian meals, if possible.  Eat seafood 2 or more times a week.  Have healthy snacks readily available, such as: ? Vegetable sticks with hummus. ? [place] yogurt. ? Fruit and nut trail mix.  Eat balanced meals throughout the week. This includes: ? Fruit: 2-3 servings a day ? Vegetables: 4-5 servings a day ? Low-fat dairy: 2 servings a day ? Fish, poultry, or lean meat: 1 serving a day ? Beans and legumes: 2 or more servings a week ? Nuts and seeds: 1-2 servings a day ? Whole grains: 6-8 servings a day ? Extra-virgin olive oil: 3-4 servings a day  Limit red meat and sweets to only a few servings a month What are my food choices?  Mediterranean diet ? Recommended ? Grains: Whole-grain pasta. Brown rice. Bulgar wheat. Polenta. Couscous. Whole-wheat bread. [place]. ? Vegetables: Artichokes. Beets. Broccoli. Cabbage. Carrots. Eggplant. Green beans. Chard. Kale. Spinach. Onions. Leeks. Peas. Squash. Tomatoes. Peppers. Radishes. ? Fruits: Apples. Apricots. Avocado. Berries. Bananas. Cherries. Dates. Figs. Grapes. Lemons. Melon. Oranges. Peaches. Plums. Pomegranate. ? Meats and other protein foods: Beans. Almonds. Sunflower seeds. Pine nuts. Peanuts. [place]. Salmon. Scallops. Shrimp. [place]. Tilapia. Clams. Oysters. Eggs. ? Dairy: Low-fat milk. Cheese. Greek yogurt. ? Beverages: Water. Red wine. Herbal tea. ? Fats and oils: Extra virgin olive oil. Avocado oil. Grape seed oil. ? Sweets and desserts: [place] yogurt with honey. Baked apples. Poached pears. Trail mix. ? Seasoning and other foods: Basil. Cilantro. Coriander. Cumin. Mint. Parsley. Sage. Rosemary. Tarragon. Garlic.  Oregano. Thyme. Pepper. Balsalmic vinegar. Tahini. Hummus. Tomato sauce. Olives. Mushrooms. ? Limit these ? Grains: Prepackaged pasta or rice dishes. Prepackaged cereal with  added sugar. ? Vegetables: Deep fried potatoes (french fries). ? Fruits: Fruit canned in syrup. ? Meats and other protein foods: Beef. Pork. Lamb. Poultry with skin. Hot dogs. [place] Salines. ? Dairy: Ice cream. Sour cream. Whole milk. ? Beverages: Juice. Sugar-sweetened soft drinks. Beer. Liquor and spirits. ? Fats and oils: Butter. [place]ola oil. Vegetable oil. Beef fat (tallow). Lard. ? Sweets and desserts: Cookies. Cakes. Pies. Candy. ? Seasoning and other foods: Mayonnaise. Premade sauces and marinades. ? The items listed may not be a complete list. Talk with your dietitian about what dietary choices are right for you. Summary  The Mediterranean diet includes both food and lifestyle choices.  Eat a variety of fresh fruits and vegetables, beans, nuts, seeds, and whole grains.  Limit the amount of red meat and sweets that you eat.  Talk with your health care provider about whether it is safe for you to drink red wine in moderation. This means 1 glass a day for nonpregnant women and 2 glasses a day for men. A glass of wine equals 5 oz (150 mL). This information is not intended to replace advice given to you by your health care provider. Make sure you discuss any questions you have with your health care provider. Document Released: 10/15/2015 Document Revised: 11/17/2015 Document Reviewed: 10/15/2015 Elsevier Interactive Patient Education  2019 [employer].

## 2018-05-22 NOTE — Progress Notes (Addendum)
Established Patient Office Visit  Subjective:  Patient ID: Corey Garrett, male    DOB: 03-14-1965  Age: 53 y.o. MRN: [id number]  CC:  Chief Complaint  Patient presents with  . Follow-up    HPI Corey Garrett presents for follow-up of his hyperuricemia and elevated triglycerides status post increasing allopurinol and fenofibrate.  He is tolerated higher doses without issue.  Continues to take Prozac and trazodone.  These are helping with anxiety depression insomnia.  He is under a lot of stress at his job.  He Electrical engineer.  He admits to being a stress eater and has been under a lot of stress recently and does his recent weight gain.  He has started an exercise program with walking with his daughter and dogs.  Patient admits that he has been on the road a lot with his job and eating out and has not been making the best dietary choices.  Pharmacy had switched him back to the 160 mg pill.  Past Medical History:  Diagnosis Date  . ANXIETY 10/07/2006  . Basal cell carcinoma    right shoulder blade  . COLONIC POLYPS, HX OF 07/10/2007  . DEPRESSION 12/29/2008  . GOUT 07/10/2007  . HEMORRHOIDS, HX OF 10/07/2006  . Hyperuricemia 04/03/2013  . Skin tag of anus     History reviewed. No pertinent surgical history.  Family History  Problem Relation Age of Onset  . Emphysema Father   . Autism Daughter        epilepsy  . Anxiety disorder Other     Social History   Socioeconomic History  . Marital status: Married    Spouse name: Not on file  . Number of children: Not on file  . Years of education: Not on file  . Highest education level: Not on file  Occupational History  . 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 Financial resource strain: Not on file  . Food insecurity:    Worry: Not on file    Inability: Not on file  . Transportation needs:    Medical: Not on file    Non-medical: Not on file  Tobacco Use  . Smoking status: Former Smoker   Types: Cigars  . Smokeless tobacco: Never Used  . Tobacco comment: Occasional cigar.  Substance and Sexual Activity  . Alcohol use: Yes    Alcohol/week: 4.0 - 6.0 standard drinks    Types: 4 - 6 Standard drinks or equivalent per week  . Drug use: No  . Sexual activity: Not on file  Lifestyle  . Physical activity:    Days per week: Not on file    Minutes per session: Not on file  . Stress: Not on file  Relationships  . Social connections:    Talks on phone: Not on file    Gets together: Not on file    Attends religious service: Not on file    Active member of club or organization: Not on file    Attends meetings of clubs or organizations: Not on file    Relationship status: Not on file  . Intimate partner violence:    Fear of current or ex partner: Not on file    Emotionally abused: Not on file    Physically abused: Not on file    Forced sexual activity: Not on file  Other Topics Concern  . Not on file  Social History Narrative  . Not on file    Outpatient Medications Prior to  Visit  Medication Sig Dispense Refill  . Multiple Vitamin (MULTIVITAMIN) tablet Take 1 tablet by mouth daily.    Marland Kitchen FLUoxetine (PROZAC) 40 MG capsule TAKE 1 CAPSULE BY MOUTH EVERY DAY 90 capsule 0  . traZODone (DESYREL) 50 MG tablet PLEASE SEE ATTACHED FOR DETAILED DIRECTIONS 90 tablet 0  . allopurinol (ZYLOPRIM) 300 MG tablet Take 1 tablet (300 mg total) by mouth daily. 90 tablet 1  . fenofibrate micronized (LOFIBRA) 200 MG capsule Take 1 capsule (200 mg total) by mouth daily before breakfast. 90 capsule 1   No facility-administered medications prior to visit.     No Known Allergies  ROS Review of Systems  Constitutional: Negative for diaphoresis, fatigue, fever and unexpected weight change.  HENT: Negative.   Eyes: Negative for photophobia and visual disturbance.  Respiratory: Negative.   Cardiovascular: Negative.   Gastrointestinal: Negative.   Endocrine: Negative for polyphagia and  polyuria.  Genitourinary: Negative.   Musculoskeletal: Negative for gait problem and joint swelling.  Skin: Negative for pallor.  Allergic/Immunologic: Negative for immunocompromised state.  Neurological: Negative for light-headedness and headaches.  Hematological: Does not bruise/bleed easily.  Psychiatric/Behavioral: Negative.       Objective:    Physical Exam  Constitutional: He is oriented to person, place, and time. He appears well-developed and well-nourished. No distress.  HENT:  Head: Normocephalic and atraumatic.  Right Ear: External ear normal.  Left Ear: External ear normal.  Eyes: Conjunctivae are normal. Right eye exhibits no discharge. Left eye exhibits no discharge. No scleral icterus.  Neck: No JVD present. No tracheal deviation present.  Pulmonary/Chest: Effort normal. No stridor.  Neurological: He is alert and oriented to person, place, and time.  Skin: Skin is warm and dry. He is not diaphoretic.  Psychiatric: He has a normal mood and affect. His behavior is normal.    BP 110/72   Pulse 64   Temp 98.5 F (36.9 C) (Oral)   Ht 6\' 2"  (1.88 m)   Wt (!) 300 lb 8 oz (136.3 kg)   SpO2 91%   BMI 38.58 kg/m  Wt Readings from Last 3 Encounters:  05/22/18 (!) 300 lb 8 oz (136.3 kg)  01/16/18 293 lb (132.9 kg)  11/20/17 296 lb (134.3 kg)   BP Readings from Last 3 Encounters:  05/22/18 110/72  01/16/18 132/78  11/20/17 110/70   Guideline developer:  UpToDate (see UpToDate for funding source) Date Released: June 2014  There are no preventive care reminders to display for this patient.  There are no preventive care reminders to display for this patient.  Lab Results  Component Value Date   TSH 1.69 11/20/2017   Lab Results  Component Value Date   WBC 6.6 11/20/2017   HGB 14.1 11/20/2017   HCT 41.0 11/20/2017   MCV 81.5 11/20/2017   PLT 205.0 11/20/2017   Lab Results  Component Value Date   NA 140 11/20/2017   K 4.0 11/20/2017   CO2 28  11/20/2017   GLUCOSE 98 11/20/2017   BUN 25 (H) 11/20/2017   CREATININE 1.16 11/20/2017   BILITOT 0.6 11/20/2017   ALKPHOS 67 11/20/2017   AST 28 11/20/2017   ALT 31 11/20/2017   PROT 7.0 11/20/2017   ALBUMIN 4.3 11/20/2017   CALCIUM 9.6 11/20/2017   GFR 70.14 11/20/2017   Lab Results  Component Value Date   CHOL 142 11/20/2017   Lab Results  Component Value Date   HDL 28.40 (L) 11/20/2017   Lab Results  Component Value  Date   LDLCALC 89 11/11/2016   Lab Results  Component Value Date   TRIG 323.0 (H) 11/20/2017   Lab Results  Component Value Date   CHOLHDL 5 11/20/2017   Lab Results  Component Value Date   HGBA1C 5.2 11/11/2016      Assessment & Plan:   Problem List Items Addressed This Visit      Other   GOUT - Primary   Relevant Medications   allopurinol (ZYLOPRIM) 300 MG tablet   Other Relevant Orders   CBC   Uric acid   Anxiety state   Relevant Medications   FLUoxetine (PROZAC) 40 MG capsule   traZODone (DESYREL) 50 MG tablet   Insomnia   Relevant Medications   traZODone (DESYREL) 50 MG tablet   Hyperuricemia   Relevant Orders   Uric acid   Mixed hyperlipidemia   Relevant Medications   fenofibrate micronized (LOFIBRA) 200 MG capsule   Other Relevant Orders   CBC   Comprehensive metabolic panel   Lipid panel   Morbid obesity ([place])      Meds ordered this encounter  Medications  . allopurinol (ZYLOPRIM) 300 MG tablet    Sig: Take 1 tablet (300 mg total) by mouth daily.    Dispense:  90 tablet    Refill:  1  . fenofibrate micronized (LOFIBRA) 200 MG capsule    Sig: Take 1 capsule (200 mg total) by mouth daily before breakfast.    Dispense:  90 capsule    Refill:  1  . FLUoxetine (PROZAC) 40 MG capsule    Sig: TAKE 1 CAPSULE BY MOUTH EVERY DAY    Dispense:  90 capsule    Refill:  1  . traZODone (DESYREL) 50 MG tablet    Sig: Take 1 tablet (50 mg total) by mouth at bedtime as needed for sleep.    Dispense:  90 tablet    Refill:  1     Follow-up: Return in about 6 months (around 11/22/2018).   We discussed a referral to weight loss management and he declined that for this time.  I am happy that he recognizes himself is a stress eater.  He was given information on the Mediterranean diet.  Discussed the association of high triglycerides and fatty liver disease.  He is aware that drinking alcohol can also raise the triglycerides.

## 2018-07-16 ENCOUNTER — Encounter: Payer: Self-pay | Admitting: Gastroenterology

## 2018-08-22 ENCOUNTER — Ambulatory Visit (INDEPENDENT_AMBULATORY_CARE_PROVIDER_SITE_OTHER): Payer: 59 | Admitting: Neurology

## 2018-08-22 DIAGNOSIS — R0683 Snoring: Secondary | ICD-10-CM

## 2018-08-22 DIAGNOSIS — G479 Sleep disorder, unspecified: Secondary | ICD-10-CM

## 2018-08-22 DIAGNOSIS — G4733 Obstructive sleep apnea (adult) (pediatric): Secondary | ICD-10-CM

## 2018-08-22 DIAGNOSIS — E669 Obesity, unspecified: Secondary | ICD-10-CM

## 2018-08-22 DIAGNOSIS — G4719 Other hypersomnia: Secondary | ICD-10-CM

## 2018-08-22 DIAGNOSIS — F458 Other somatoform disorders: Secondary | ICD-10-CM

## 2018-08-23 ENCOUNTER — Ambulatory Visit (AMBULATORY_SURGERY_CENTER): Payer: Self-pay

## 2018-08-23 ENCOUNTER — Other Ambulatory Visit: Payer: Self-pay

## 2018-08-23 VITALS — Ht 74.0 in

## 2018-08-23 DIAGNOSIS — Z860100 Personal history of colon polyps, unspecified: Secondary | ICD-10-CM

## 2018-08-23 MED ORDER — NA SULFATE-K SULFATE-MG SULF 17.5-3.13-1.6 GM/177ML PO SOLN: 1.0000 | Freq: Once | ORAL | 0 refills | Status: AC

## 2018-08-23 NOTE — Progress Notes (Signed)
Denies allergies to eggs or soy products. Denies complication of anesthesia or sedation. Denies use of weight loss medication. Denies use of O2.   Emmi instructions given for colonoscopy.   Pre-visit was conducted by phone due to Covid 19. Instructions were reviewed and mailed to patients confirmed home address. Patient was encouraged to call if he had any questions regarding instructions.

## 2018-08-27 ENCOUNTER — Encounter: Payer: Self-pay | Admitting: Gastroenterology

## 2018-08-29 ENCOUNTER — Telehealth: Payer: Self-pay

## 2018-08-29 NOTE — Procedures (Signed)
Patient Information     First Name: Corey Last Name: Garrett ID: [id number]  Birth Date: 1965/08/30 Age: 53 Gender: Male  Referring Provider: Krystal Clark, MD BMI: 37.6 (W=293 lb, H=6' 2'')  Neck Circ.:  19.5" Epworth:  13/24   Sleep Study Information    Study Date: Aug 22, 2018 S/H/A Version: 001.001.001.001 / 4.1.1528 / 29  History: 53 year old man with a history of anxiety, depression, gout, hyperlipidemia, and obesity, who reports snoring and excessive daytime somnolence.    Summary & Diagnosis:     OSA Recommendations:      This home sleep test demonstrates overall mild obstructive sleep apnea with a total AHI of 7/hour and O2 nadir of 78%. Given the patient's medical history and sleep related complaints, treatment with positive airway pressure is recommended. This can be achieved in the form of autoPAP trial/titration at home. A full night CPAP titration study may help with proper treatment settings and mask fitting if needed. Alternative treatments may include weight loss along with avoidance of the supine sleep position, or an oral appliance in appropriate candidates.   Please note that untreated obstructive sleep apnea may carry additional perioperative morbidity. Patients with significant obstructive sleep apnea should receive perioperative PAP therapy and the surgeons and particularly the anesthesiologist should be informed of the diagnosis and the severity of the sleep disordered breathing. The patient should be cautioned not to drive, work at heights, or operate dangerous or heavy equipment when tired or sleepy. Review and reiteration of good sleep hygiene measures should be pursued with any patient. Other causes of the patient's symptoms, including circadian rhythm disturbances, an underlying mood disorder, medication effect and/or an underlying medical problem cannot be ruled out based on this test. Clinical correlation is recommended.   The patient and his referring provider will  be notified of the test results. The patient will be seen in follow up in sleep clinic at [facility].  I certify that I have reviewed the raw data recording prior to the issuance of this report in accordance with the standards of the American Academy of Sleep Medicine (AASM).  [name], MD, PhD [facility]) Diplomat, ABPN (Neurology and Sleep)       Start Study Time: End Study Time: Total Recording Time:  11:01:24 PM   7:20:03 AM   8 h, 18 min  Total Sleep Time % REM of Sleep Time:  7 h, 12 min  24.5    Mean: 94 Minimum: 78 Maximum: 98  Mean of Desaturations Nadirs (%):   89  Oxygen Desaturation %:   4-9 10-20 >20 Total  Events Number Total    18  2 90.0 10.0  0 0.0  20 100.0  Oxygen Saturation: <90 <=88 <85 <80 <70  Duration (minutes): Sleep % 0.8 0.2  0.3 0.1  0.1 0.0 0.0 0.0 0.0 0.0     Respiratory Indices       Total Events REM NREM All Night  pRDI: pAHI: ODI:  51  50  20 13.2 12.6 4.6 5.2 5.2 2.2 7.1 7.0 2.8       Pulse Rate Statistics during Sleep (BPM)      Mean: 50 Minimum: 40 Maximum:  76    Oxygen Saturation Statistics   Sleep Summary   Indices are calculated using technically valid sleep time of  7 hrs, 10 min. pRDI/pAHI are calculated using oxi desaturations ? 3%  Sleep Stages Chart   * Reference values are according to AASM guidelines

## 2018-08-29 NOTE — Addendum Note (Signed)
Addended by: [name] on: 08/29/2018 08:22 AM   Modules accepted: Orders

## 2018-08-29 NOTE — Progress Notes (Signed)
Patient referred by Dr. Ethelene Hal, seen by me on 01/16/18, HST on 08/22/18.    Please call and notify the patient that the recent home sleep test showed obstructive sleep apnea. OSA is overall mild, but worth treating to see if he feels better after treatment and to help his loud snoring, which has been disturbing to his wife. To that end I recommend treatment for this in the form of autoPAP, which means, that we don't have to bring him in for a sleep study with CPAP, but will let him try an autoPAP machine at home, through a DME company (of his choice, or as per insurance requirement). The DME representative will educate him on how to use the machine, how to put the mask on, etc. I have placed an order in the chart. Please send referral, talk to patient, send report to referring MD. We will need a FU in sleep clinic for 10 weeks post-PAP set up, please arrange that with me or one of our NPs.  He has seen an oral surgeon as I recall. Alternative Rx for his mild OSA and snoring may respond to an oral appliance (typically through a dentist or sometimes oral surgeon). Weight loss will likely help alleviate his mild OSA and snoring. Thanks,   [name], MD, PhD [facility])

## 2018-08-29 NOTE — Telephone Encounter (Signed)
I called pt and discussed his sleep study results and recommendations. Pt reports that he will discuss with his oral surgeon other treatment options. Pt says that he is interested in an oral surgery and declined auto pap at this time. Pt verbalized understanding of results. Pt had no questions at this time but was encouraged to call back if questions arise.

## 2018-08-29 NOTE — Telephone Encounter (Signed)
-----   Message from [name], MD sent at 08/29/2018  8:22 AM EDT ----- Patient referred by Dr. Ethelene Hal, seen by me on 01/16/18, HST on 08/22/18.    Please call and notify the patient that the recent home sleep test showed obstructive sleep apnea. OSA is overall mild, but worth treating to see if he feels better after treatment and to help his loud snoring, which has been disturbing to his wife. To that end I recommend treatment for this in the form of autoPAP, which means, that we don't have to bring him in for a sleep study with CPAP, but will let him try an autoPAP machine at home, through a DME company (of his choice, or as per insurance requirement). The DME representative will educate him on how to use the machine, how to put the mask on, etc. I have placed an order in the chart. Please send referral, talk to patient, send report to referring MD. We will need a FU in sleep clinic for 10 weeks post-PAP set up, please arrange that with me or one of our NPs.  He has seen an oral surgeon as I recall. Alternative Rx for his mild OSA and snoring may respond to an oral appliance (typically through a dentist or sometimes oral surgeon). Weight loss will likely help alleviate his mild OSA and snoring. Thanks,   [name], MD, PhD [facility])

## 2018-09-05 ENCOUNTER — Telehealth: Payer: Self-pay | Admitting: Gastroenterology

## 2018-09-05 NOTE — Telephone Encounter (Signed)

## 2018-09-06 ENCOUNTER — Other Ambulatory Visit: Payer: Self-pay

## 2018-09-06 ENCOUNTER — Ambulatory Visit (AMBULATORY_SURGERY_CENTER): Payer: 59 | Admitting: Gastroenterology

## 2018-09-06 ENCOUNTER — Encounter: Payer: Self-pay | Admitting: Gastroenterology

## 2018-09-06 VITALS — BP 96/57 | HR 54 | Temp 98.5°F | Resp 12 | Ht 74.0 in | Wt 300.0 lb

## 2018-09-06 DIAGNOSIS — Z86010 Personal history of colonic polyps: Secondary | ICD-10-CM

## 2018-09-06 DIAGNOSIS — D123 Benign neoplasm of transverse colon: Secondary | ICD-10-CM | POA: Diagnosis not present

## 2018-09-06 DIAGNOSIS — Z800 Family history of malignant neoplasm of digestive organs: Secondary | ICD-10-CM

## 2018-09-06 DIAGNOSIS — D214 Benign neoplasm of connective and other soft tissue of abdomen: Secondary | ICD-10-CM

## 2018-09-06 MED ORDER — SODIUM CHLORIDE 0.9 % IV SOLN: 500.0000 mL | Freq: Once | INTRAVENOUS | Status: DC

## 2018-09-06 NOTE — Op Note (Signed)
[facility] Patient Name: Corey Garrett Procedure Date: 09/06/2018 11:10 AM MRN: [id number] Endoscopist: Ladene Artist , MD Age: 53 Referring MD:  Date of Birth: [birth year] Gender: Male Account #: [id number] Procedure:                Colonoscopy Indications:              Surveillance: Personal history of adenomatous                            polyps on last colonoscopy 5 years ago. Family                            history of colon cancer, first degree relative. Medicines:                Monitored Anesthesia Care Procedure:                Pre-Anesthesia Assessment:                           - Prior to the procedure, a History and Physical                            was performed, and patient medications and                            allergies were reviewed. The patient's tolerance of                            previous anesthesia was also reviewed. The risks                            and benefits of the procedure and the sedation                            options and risks were discussed with the patient.                            All questions were answered, and informed consent                            was obtained. Prior Anticoagulants: The patient has                            taken no previous anticoagulant or antiplatelet                            agents. ASA Grade Assessment: II - A patient with                            mild systemic disease. After reviewing the risks                            and benefits, the patient was deemed in  satisfactory condition to undergo the procedure.                           After obtaining informed consent, the colonoscope                            was passed under direct vision. Throughout the                            procedure, the patient's blood pressure, pulse, and                            oxygen saturations were monitored continuously. The                            Colonoscope was  introduced through the anus and                            advanced to the the cecum, identified by                            appendiceal orifice and ileocecal valve. The                            ileocecal valve, appendiceal orifice, and rectum                            were photographed. The quality of the bowel                            preparation was good. The colonoscopy was performed                            without difficulty. The patient tolerated the                            procedure well. Scope In: 11:20:16 AM Scope Out: 11:44:36 AM Scope Withdrawal Time: 0 hours 18 minutes 49 seconds  Total Procedure Duration: 0 hours 24 minutes 20 seconds  Findings:                 The perianal and digital rectal examinations were                            normal.                           Three sessile polyps were found in the transverse                            colon. The polyps were 6 to 7 mm in size. These                            polyps were removed with a cold snare. Resection  and retrieval were complete.                           A 4 mm polyp was found in the transverse colon. The                            polyp was sessile. The polyp was removed with a                            cold biopsy forceps. Resection and retrieval were                            complete.                           A few small-mouthed diverticula were found in the                            left colon.                           Internal hemorrhoids were found during                            retroflexion. The hemorrhoids were small and Grade                            I (internal hemorrhoids that do not prolapse).                           The exam was otherwise without abnormality on                            direct and retroflexion views. Complications:            No immediate complications. Estimated blood loss:                            None. Estimated Blood  Loss:     Estimated blood loss: none. Impression:               - Three 6 to 7 mm polyps in the transverse colon,                            removed with a cold snare. Resected and retrieved.                           - One 4 mm polyp in the transverse colon, removed                            with a cold biopsy forceps. Resected and retrieved.                           - Diverticulosis in the left colon.                           -  Internal hemorrhoids.                           - The examination was otherwise normal on direct                            and retroflexion views. Recommendation:           - Repeat colonoscopy date to be determined after                            pending pathology results are reviewed for                            surveillance based on pathology results.                           - Patient has a contact number available for                            emergencies. The signs and symptoms of potential                            delayed complications were discussed with the                            patient. Return to normal activities tomorrow.                            Written discharge instructions were provided to the                            patient.                           - Resume previous diet.                           - Continue present medications.                           - Await pathology results. Ladene Artist, MD 09/06/2018 11:48:56 AM This report has been signed electronically.

## 2018-09-06 NOTE — Progress Notes (Signed)
Called to room to assist during endoscopic procedure.  Patient ID and intended procedure confirmed with present staff. Received instructions for my participation in the procedure from the performing physician.  

## 2018-09-06 NOTE — Progress Notes (Signed)
A and O x3. Report to RN. Tolerated MAC anesthesia well.

## 2018-09-06 NOTE — Patient Instructions (Signed)
Thank you for allowing [place] to care for you today!  Await pathology results by mail, approximately 2 weeks.  Recommendation for next colonoscopy will be made at that time.  Resume previous diet and medications today.  Return to your normal activities tomorrow.      YOU HAD AN ENDOSCOPIC PROCEDURE TODAY AT [facility]:   Refer to the procedure report that was given to you for any specific questions about what was found during the examination.  If the procedure report does not answer your questions, please call your gastroenterologist to clarify.  If you requested that your care partner not be given the details of your procedure findings, then the procedure report has been included in a sealed envelope for you to review at your convenience later.  YOU SHOULD EXPECT: Some feelings of bloating in the abdomen. Passage of more gas than usual.  Walking can help get rid of the air that was put into your GI tract during the procedure and reduce the bloating. If you had a lower endoscopy (such as a colonoscopy or flexible sigmoidoscopy) you may notice spotting of blood in your stool or on the toilet paper. If you underwent a bowel prep for your procedure, you may not have a normal bowel movement for a few days.  Please Note:  You might notice some irritation and congestion in your nose or some drainage.  This is from the oxygen used during your procedure.  There is no need for concern and it should clear up in a day or so.  SYMPTOMS TO REPORT IMMEDIATELY:   Following lower endoscopy (colonoscopy or flexible sigmoidoscopy):  Excessive amounts of blood in the stool  Significant tenderness or worsening of abdominal pains  Swelling of the abdomen that is new, acute  Fever of 100F or higher    For urgent or emergent issues, a gastroenterologist can be reached at any hour by calling [phone].   DIET:  We do recommend a small meal at first, but then you may proceed to your regular  diet.  Drink plenty of fluids but you should avoid alcoholic beverages for 24 hours.  ACTIVITY:  You should plan to take it easy for the rest of today and you should NOT DRIVE or use heavy machinery until tomorrow (because of the sedation medicines used during the test).    FOLLOW UP: Our staff will call the number listed on your records 48-72 hours following your procedure to check on you and address any questions or concerns that you may have regarding the information given to you following your procedure. If we do not reach you, we will leave a message.  We will attempt to reach you two times.  During this call, we will ask if you have developed any symptoms of COVID 19. If you develop any symptoms (ie: fever, flu-like symptoms, shortness of breath, cough etc.) before then, please call [phone].  If you test positive for Covid 19 in the 2 weeks post procedure, please call and report this information to [place].    If any biopsies were taken you will be contacted by phone or by letter within the next 1-3 weeks.  Please call us at [phone] if you have not heard about the biopsies in 3 weeks.    SIGNATURES/CONFIDENTIALITY: You and/or your care partner have signed paperwork which will be entered into your electronic medical record.  These signatures attest to the fact that that the information above on your After Visit  Summary has been reviewed and is understood.  Full responsibility of the confidentiality of this discharge information lies with you and/or your care-partner. 

## 2018-09-06 NOTE — Progress Notes (Signed)
Pt's states no medical or surgical changes since previsit or office visit.  Temp-Corey Garrett  Vital signs-judy bronson

## 2018-09-10 ENCOUNTER — Telehealth: Payer: Self-pay | Admitting: *Deleted

## 2018-09-10 NOTE — Telephone Encounter (Signed)
  Follow up Call-  Call back number 09/06/2018  Post procedure Call Back phone  # [phone]  Permission to leave phone message Yes  Some recent data might be hidden     Patient questions:  Do you have a fever, pain , or abdominal swelling? No. Pain Score  0 *  Have you tolerated food without any problems? Yes.    Have you been able to return to your normal activities? Yes.    Do you have any questions about your discharge instructions: Diet   No. Medications  No. Follow up visit  No.  Do you have questions or concerns about your Care? No.  Actions: * If pain score is 4 or above: No action needed, pain <4.  1. Have you developed a fever since your procedure? no  2.   Have you had an respiratory symptoms (SOB or cough) since your procedure? no  3.   Have you tested positive for COVID 19 since your procedure no  4.   Have you had any family members/close contacts diagnosed with the COVID 19 since your procedure?  no   If yes to any of these questions please route to Joylene John, RN and Alphonsa Gin, Therapist, sports.

## 2018-09-19 ENCOUNTER — Encounter: Payer: Self-pay | Admitting: Gastroenterology

## 2018-10-20 ENCOUNTER — Encounter: Payer: Self-pay | Admitting: Family Medicine

## 2018-10-20 DIAGNOSIS — F411 Generalized anxiety disorder: Secondary | ICD-10-CM

## 2018-10-20 DIAGNOSIS — G4700 Insomnia, unspecified: Secondary | ICD-10-CM

## 2018-10-22 MED ORDER — ALLOPURINOL 300 MG PO TABS: 300.0000 mg | ORAL_TABLET | Freq: Every day | ORAL | 1 refills | Status: DC

## 2018-10-22 MED ORDER — TRAZODONE HCL 50 MG PO TABS: 50.0000 mg | ORAL_TABLET | Freq: Every evening | ORAL | 1 refills | Status: DC | PRN

## 2018-10-22 MED ORDER — FENOFIBRATE MICRONIZED 200 MG PO CAPS: 200.0000 mg | ORAL_CAPSULE | Freq: Every day | ORAL | 1 refills | Status: DC

## 2018-10-22 MED ORDER — FLUOXETINE HCL 40 MG PO CAPS: ORAL_CAPSULE | ORAL | 1 refills | Status: DC

## 2018-11-15 ENCOUNTER — Other Ambulatory Visit: Payer: Self-pay | Admitting: Family Medicine

## 2018-11-15 DIAGNOSIS — G4700 Insomnia, unspecified: Secondary | ICD-10-CM

## 2018-11-15 DIAGNOSIS — F411 Generalized anxiety disorder: Secondary | ICD-10-CM

## 2018-11-16 ENCOUNTER — Other Ambulatory Visit: Payer: Self-pay | Admitting: Family Medicine

## 2018-11-16 DIAGNOSIS — E782 Mixed hyperlipidemia: Secondary | ICD-10-CM

## 2018-11-22 ENCOUNTER — Telehealth: Payer: Self-pay

## 2018-11-22 NOTE — Telephone Encounter (Signed)

## 2018-11-23 ENCOUNTER — Encounter: Payer: Self-pay | Admitting: Family Medicine

## 2018-11-23 ENCOUNTER — Ambulatory Visit (INDEPENDENT_AMBULATORY_CARE_PROVIDER_SITE_OTHER): Payer: 59 | Admitting: Family Medicine

## 2018-11-23 ENCOUNTER — Other Ambulatory Visit: Payer: Self-pay

## 2018-11-23 VITALS — BP 120/70 | HR 66 | Ht 74.0 in | Wt 281.0 lb

## 2018-11-23 DIAGNOSIS — Z0000 Encounter for general adult medical examination without abnormal findings: Secondary | ICD-10-CM

## 2018-11-23 DIAGNOSIS — G4700 Insomnia, unspecified: Secondary | ICD-10-CM | POA: Diagnosis not present

## 2018-11-23 DIAGNOSIS — F324 Major depressive disorder, single episode, in partial remission: Secondary | ICD-10-CM

## 2018-11-23 DIAGNOSIS — M109 Gout, unspecified: Secondary | ICD-10-CM | POA: Diagnosis not present

## 2018-11-23 DIAGNOSIS — F411 Generalized anxiety disorder: Secondary | ICD-10-CM | POA: Diagnosis not present

## 2018-11-23 DIAGNOSIS — E782 Mixed hyperlipidemia: Secondary | ICD-10-CM

## 2018-11-23 LAB — CBC
HCT: 42.5 % (ref 39.0–52.0)
Hemoglobin: 14.4 g/dL (ref 13.0–17.0)
MCHC: 33.8 g/dL (ref 30.0–36.0)
MCV: 83.6 fl (ref 78.0–100.0)
Platelets: 189 10*3/uL (ref 150.0–400.0)
RBC: 5.08 Mil/uL (ref 4.22–5.81)
RDW: 14.9 % (ref 11.5–15.5)
WBC: 7.1 10*3/uL (ref 4.0–10.5)

## 2018-11-23 LAB — URINALYSIS, ROUTINE W REFLEX MICROSCOPIC
Bilirubin Urine: NEGATIVE
Hgb urine dipstick: NEGATIVE
Ketones, ur: NEGATIVE
Leukocytes,Ua: NEGATIVE
Nitrite: NEGATIVE
Specific Gravity, Urine: 1.025 (ref 1.000–1.030)
Total Protein, Urine: NEGATIVE
Urine Glucose: NEGATIVE
Urobilinogen, UA: 0.2 (ref 0.0–1.0)
pH: 5.5 (ref 5.0–8.0)

## 2018-11-23 LAB — COMPREHENSIVE METABOLIC PANEL
ALT: 21 U/L (ref 0–53)
AST: 20 U/L (ref 0–37)
Albumin: 4.2 g/dL (ref 3.5–5.2)
Alkaline Phosphatase: 57 U/L (ref 39–117)
BUN: 21 mg/dL (ref 6–23)
CO2: 29 mEq/L (ref 19–32)
Calcium: 9.7 mg/dL (ref 8.4–10.5)
Chloride: 105 mEq/L (ref 96–112)
Creatinine, Ser: 1.06 mg/dL (ref 0.40–1.50)
GFR: 72.95 mL/min (ref >60.00)
Glucose, Bld: 98 mg/dL (ref 70–99)
Potassium: 4 mEq/L (ref 3.5–5.1)
Sodium: 141 mEq/L (ref 135–145)
Total Bilirubin: 0.4 mg/dL (ref 0.2–1.2)
Total Protein: 6.7 g/dL (ref 6.0–8.3)

## 2018-11-23 LAB — LIPID PANEL
Cholesterol: 148 mg/dL (ref 0–200)
HDL: 32.4 mg/dL — ABNORMAL LOW (ref >39.00)
NonHDL: 115.63
Total CHOL/HDL Ratio: 5
Triglycerides: 215 mg/dL — ABNORMAL HIGH (ref 0.0–149.0)
VLDL: 43 mg/dL — ABNORMAL HIGH (ref 0.0–40.0)

## 2018-11-23 LAB — PSA: PSA: 0.46 ng/mL (ref 0.10–4.00)

## 2018-11-23 LAB — URIC ACID: Uric Acid, Serum: 7.6 mg/dL (ref 4.0–7.8)

## 2018-11-23 LAB — LDL CHOLESTEROL, DIRECT: Direct LDL: 88 mg/dL

## 2018-11-23 NOTE — Progress Notes (Signed)
Established Patient Office Visit  Subjective:  Patient ID: Corey Garrett, male    DOB: 30-May-1965  Age: 53 y.o. MRN: [id number]  CC:  Chief Complaint  Patient presents with  . Annual Exam    HPI Corey Garrett presents for follow-up of his depression, anxiety.  Gout sleep apnea and hyperuricemia.  Has not had a gouty attack since he started taking allopurinol.  Depression and anxiety are well controlled.  Sometimes does not sleep but has been able to address this problem by taking various doses of melatonin with varying doses of trazodone.  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  Getting a lot of exercise by playing golf and walking.  Jaw reconstruction surgery has been highly recommended to address his bruxism and apnea.  He is in the process of considering.  Colonoscopy this past summer showed a few polyps that were benign.  Urine flow has been good no issues there.  Past Medical History:  Diagnosis Date  . ANXIETY 10/07/2006  . Basal cell carcinoma    right shoulder blade  . COLONIC POLYPS, HX OF 07/10/2007  . DEPRESSION 12/29/2008  . GOUT 07/10/2007  . HEMORRHOIDS, HX OF 10/07/2006  . Hyperlipidemia   . Hyperuricemia 04/03/2013  . Skin tag of anus     Past Surgical History:  Procedure Laterality Date  . COLONOSCOPY    . POLYPECTOMY      Family History  Problem Relation Age of Onset  . Emphysema Father   . Colon cancer Father   . Autism Daughter        epilepsy  . Anxiety disorder Other   . Esophageal cancer Neg Hx   . Rectal cancer Neg Hx   . Stomach cancer Neg Hx     Social History   Socioeconomic History  . Marital status: Married    Spouse name: Not on file  . Number of children: Not on file  . Years of education: Not on file  . Highest education level: Not on file  Occupational History  . 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 Financial resource strain: Not on file  . Food insecurity    Worry: Not on file    Inability: Not on file  . Transportation needs    Medical: Not on file    Non-medical: Not on file  Tobacco Use  . Smoking status: Former Smoker    Types: Cigars  . Smokeless tobacco: Never Used  . Tobacco comment: Occasional cigar.  Substance and Sexual Activity  . Alcohol use: Yes    Alcohol/week: 4.0 - 6.0 standard drinks    Types: 4 - 6 Standard drinks or equivalent per week  . Drug use: No  . Sexual activity: Not on file  Lifestyle  . Physical activity    Days per week: Not on file    Minutes per session: Not on file  . Stress: Not on file  Relationships  . Social Herbalist on phone: Not on file    Gets together: Not on file    Attends religious service: Not on file    Active member of club or organization: Not on file    Attends meetings of clubs or organizations: Not on file    Relationship status: Not on file  . Intimate partner violence    Fear of current or ex partner: Not on file    Emotionally abused: Not  on file    Physically abused: Not on file    Forced sexual activity: Not on file  Other Topics Concern  . Not on file  Social History Narrative  . Not on file    Outpatient Medications Prior to Visit  Medication Sig Dispense Refill  . allopurinol (ZYLOPRIM) 300 MG tablet Take 1 tablet (300 mg total) by mouth daily. 90 tablet 1  . fenofibrate micronized (LOFIBRA) 200 MG capsule TAKE 1 CAPSULE (200 MG TOTAL) BY MOUTH DAILY BEFORE BREAKFAST. 90 capsule 1  . FLUoxetine (PROZAC) 40 MG capsule TAKE 1 CAPSULE BY MOUTH EVERY DAY 90 capsule 0  . Multiple Vitamin (MULTIVITAMIN) tablet Take 1 tablet by mouth daily.    . traZODone (DESYREL) 50 MG tablet TAKE 1 TABLET (50 MG TOTAL) BY MOUTH AT BEDTIME AS NEEDED FOR SLEEP. 90 tablet 0   No facility-administered medications prior to visit.     No Known Allergies  ROS Review of Systems  Constitutional: Negative.   HENT: Positive for dental problem.   Eyes: Negative for  photophobia and visual disturbance.  Respiratory: Negative.   Cardiovascular: Negative.   Gastrointestinal: Negative.   Endocrine: Negative for polyphagia and polyuria.  Genitourinary: Negative for difficulty urinating, frequency and urgency.  Musculoskeletal: Negative for gait problem and joint swelling.  Skin: Negative for pallor and rash.  Allergic/Immunologic: Negative for immunocompromised state.  Neurological: Negative for light-headedness and numbness.  Hematological: Does not bruise/bleed easily.   Depression screen [facility] 2/9 11/23/2018 11/20/2017 07/01/2015  Decreased Interest 0 0 0  Down, Depressed, Hopeless 0 0 0  PHQ - 2 Score 0 0 0  Altered sleeping 1 2 -  Tired, decreased energy 1 1 -  Change in appetite 1 0 -  Feeling bad or failure about yourself  0 0 -  Trouble concentrating 0 0 -  Moving slowly or fidgety/restless 1 0 -  Suicidal thoughts 0 0 -  PHQ-9 Score 4 3 -      Objective:    Physical Exam  Constitutional: He is oriented to person, place, and time. He appears well-developed and well-nourished. No distress.  HENT:  Head: Normocephalic and atraumatic.  Right Ear: External ear normal.  Left Ear: External ear normal.  Mouth/Throat: Oropharynx is clear and moist.  Eyes: Pupils are equal, round, and reactive to light. Conjunctivae are normal. Right eye exhibits no discharge. Left eye exhibits no discharge. No scleral icterus.  Neck: Neck supple. No JVD present. No tracheal deviation present. No thyromegaly present.  Cardiovascular: Normal rate, regular rhythm and normal heart sounds.  Pulmonary/Chest: Effort normal and breath sounds normal. No stridor.  Abdominal: Bowel sounds are normal. He exhibits no distension. There is no abdominal tenderness. There is no rebound and no guarding.  Genitourinary: Rectum:     Guaiac result negative.     External hemorrhoid present.     No rectal mass, anal fissure, tenderness, internal hemorrhoid or abnormal anal tone.   Prostate is enlarged. Prostate is not tender.     Musculoskeletal:        General: No edema.  Lymphadenopathy:    He has no cervical adenopathy.  Neurological: He is alert and oriented to person, place, and time.  Skin: Skin is warm and dry. He is not diaphoretic.  Psychiatric: He has a normal mood and affect. His behavior is normal.    BP 120/70   Pulse 66   Ht 6\' 2"  (1.88 m)   Wt 281 lb (127.5 kg)  SpO2 96%   BMI 36.08 kg/m  Wt Readings from Last 3 Encounters:  11/23/18 281 lb (127.5 kg)  09/06/18 300 lb (136.1 kg)  05/22/18 (!) 300 lb 8 oz (136.3 kg)   BP Readings from Last 3 Encounters:  11/23/18 120/70  09/06/18 (!) 96/57  05/22/18 110/72   Guideline developer:  UpToDate (see UpToDate for funding source) Date Released: June 2014  There are no preventive care reminders to display for this patient.  There are no preventive care reminders to display for this patient.  Lab Results  Component Value Date   TSH 1.69 11/20/2017   Lab Results  Component Value Date   WBC 8.0 05/22/2018   HGB 14.6 05/22/2018   HCT 42.7 05/22/2018   MCV 82.8 05/22/2018   PLT 221.0 05/22/2018   Lab Results  Component Value Date   NA 140 05/22/2018   K 4.1 05/22/2018   CO2 30 05/22/2018   GLUCOSE 114 (H) 05/22/2018   BUN 24 (H) 05/22/2018   CREATININE 1.13 05/22/2018   BILITOT 0.4 05/22/2018   ALKPHOS 68 05/22/2018   AST 25 05/22/2018   ALT 33 05/22/2018   PROT 7.3 05/22/2018   ALBUMIN 4.3 05/22/2018   CALCIUM 9.8 05/22/2018   GFR 67.89 05/22/2018   Lab Results  Component Value Date   CHOL 165 05/22/2018   Lab Results  Component Value Date   HDL 30.30 (L) 05/22/2018   Lab Results  Component Value Date   LDLCALC 89 11/11/2016   Lab Results  Component Value Date   TRIG 292.0 (H) 05/22/2018   Lab Results  Component Value Date   CHOLHDL 5 05/22/2018   Lab Results  Component Value Date   HGBA1C 5.2 11/11/2016      Assessment & Plan:   Problem List  Items Addressed This Visit      Other   GOUT   Relevant Orders   CBC   Comprehensive metabolic panel   Urinalysis, Routine w reflex microscopic   Uric acid   Anxiety state   Depression, major, single episode, in partial remission (HCC)   Insomnia - Primary   Mixed hyperlipidemia   Relevant Orders   CBC   Comprehensive metabolic panel   LDL cholesterol, direct   Lipid panel    Other Visit Diagnoses    Healthcare maintenance       Relevant Orders   PSA      No orders of the defined types were placed in this encounter.   Follow-up: Return in about 6 months (around 05/23/2019).   Patient was given information on health maintenance and disease prevention as well as low purine diet.  We will continue exercising.  Continue all medicines as above.

## 2018-11-23 NOTE — Patient Instructions (Signed)
Health Maintenance, Male Adopting a healthy lifestyle and getting preventive care are important in promoting health and wellness. Ask your health care provider about:  The right schedule for you to have regular tests and exams.  Things you can do on your own to prevent diseases and keep yourself healthy. What should I know about diet, weight, and exercise? Eat a healthy diet   Eat a diet that includes plenty of vegetables, fruits, low-fat dairy products, and lean protein.  Do not eat a lot of foods that are high in solid fats, added sugars, or sodium. Maintain a healthy weight Body mass index (BMI) is a measurement that can be used to identify possible weight problems. It estimates body fat based on height and weight. Your health care provider can help determine your BMI and help you achieve or maintain a healthy weight. Get regular exercise Get regular exercise. This is one of the most important things you can do for your health. Most adults should:  Exercise for at least 150 minutes each week. The exercise should increase your heart rate and make you sweat (moderate-intensity exercise).  Do strengthening exercises at least twice a week. This is in addition to the moderate-intensity exercise.  Spend less time sitting. Even light physical activity can be beneficial. Watch cholesterol and blood lipids Have your blood tested for lipids and cholesterol at 53 years of age, then have this test every 5 years. You may need to have your cholesterol levels checked more often if:  Your lipid or cholesterol levels are high.  You are older than 53 years of age.  You are at high risk for heart disease. What should I know about cancer screening? Many types of cancers can be detected early and may often be prevented. Depending on your health history and family history, you may need to have cancer screening at various ages. This may include screening for:  Colorectal cancer.  Prostate cancer.   Skin cancer.  Lung cancer. What should I know about heart disease, diabetes, and high blood pressure? Blood pressure and heart disease  High blood pressure causes heart disease and increases the risk of stroke. This is more likely to develop in people who have high blood pressure readings, are of African descent, or are overweight.  Talk with your health care provider about your target blood pressure readings.  Have your blood pressure checked: ? Every 3-5 years if you are 18-39 years of age. ? Every year if you are 40 years old or older.  If you are between the ages of 65 and 75 and are a current or former smoker, ask your health care provider if you should have a one-time screening for abdominal aortic aneurysm (AAA). Diabetes Have regular diabetes screenings. This checks your fasting blood sugar level. Have the screening done:  Once every three years after age 45 if you are at a normal weight and have a low risk for diabetes.  More often and at a younger age if you are overweight or have a high risk for diabetes. What should I know about preventing infection? Hepatitis B If you have a higher risk for hepatitis B, you should be screened for this virus. Talk with your health care provider to find out if you are at risk for hepatitis B infection. Hepatitis C Blood testing is recommended for:  Everyone born from 1945 through 1965.  Anyone with known risk factors for hepatitis C. Sexually transmitted infections (STIs)  You should be screened each year   for STIs, including gonorrhea and chlamydia, if: ? You are sexually active and are younger than 53 years of age. ? You are older than 53 years of age and your health care provider tells you that you are at risk for this type of infection. ? Your sexual activity has changed since you were last screened, and you are at increased risk for chlamydia or gonorrhea. Ask your health care provider if you are at risk.  Ask your health care  provider about whether you are at high risk for HIV. Your health care provider may recommend a prescription medicine to help prevent HIV infection. If you choose to take medicine to prevent HIV, you should first get tested for HIV. You should then be tested every 3 months for as long as you are taking the medicine. Follow these instructions at home: Lifestyle  Do not use any products that contain nicotine or tobacco, such as cigarettes, e-cigarettes, and chewing tobacco. If you need help quitting, ask your health care provider.  Do not use street drugs.  Do not share needles.  Ask your health care provider for help if you need support or information about quitting drugs. Alcohol use  Do not drink alcohol if your health care provider tells you not to drink.  If you drink alcohol: ? Limit how much you have to 0-2 drinks a day. ? Be aware of how much alcohol is in your drink. In the [place] one drink equals one 12 oz bottle of beer (355 mL), one 5 oz glass of wine (148 mL), or one 1 oz glass of hard liquor (44 mL). General instructions  Schedule regular health, dental, and eye exams.  Stay current with your vaccines.  Tell your health care provider if: ? You often feel depressed. ? You have ever been abused or do not feel safe at home. Summary  Adopting a healthy lifestyle and getting preventive care are important in promoting health and wellness.  Follow your health care provider's instructions about healthy diet, exercising, and getting tested or screened for diseases.  Follow your health care provider's instructions on monitoring your cholesterol and blood pressure. This information is not intended to replace advice given to you by your health care provider. Make sure you discuss any questions you have with your health care provider. Document Released: 08/20/2007 Document Revised: 02/14/2018 Document Reviewed: 02/14/2018 Elsevier Patient Education  [place] A low-purine eating plan involves making food choices to limit your intake of purine. Purine is a kind of uric acid. Too much uric acid in your blood can cause certain conditions, such as gout and kidney stones. Eating a low-purine diet can help control these conditions. What are tips for following this plan? Reading food labels   Avoid foods with saturated or Trans fat.  Check the ingredient list of grains-based foods, such as bread and cereal, to make sure that they contain whole grains.  Check the ingredient list of sauces or soups to make sure they do not contain meat or fish.  When choosing soft drinks, check the ingredient list to make sure they do not contain high-fructose corn syrup. Shopping  Buy plenty of fresh fruits and vegetables.  Avoid buying canned or fresh fish.  Buy dairy products labeled as low-fat or nonfat.  Avoid buying premade or processed foods. These foods are often high in fat, salt (sodium), and added sugar. Cooking  Use olive oil instead of butter when cooking. Oils like olive  oil, canola oil, and sunflower oil contain healthy fats. Meal planning  Learn which foods do or do not affect you. If you find out that a food tends to cause your gout symptoms to flare up, avoid eating that food. You can enjoy foods that do not cause problems. If you have any questions about a food item, talk with your dietitian or health care provider.  Limit foods high in fat, especially saturated fat. Fat makes it harder for your body to get rid of uric acid.  Choose foods that are lower in fat and are lean sources of protein. General guidelines  Limit alcohol intake to no more than 1 drink a day for nonpregnant women and 2 drinks a day for men. One drink equals 12 oz of beer, 5 oz of wine, or 1 oz of hard liquor. Alcohol can affect the way your body gets rid of uric acid.  Drink plenty of water to keep your urine clear or pale yellow. Fluids can help remove uric  acid from your body.  If directed by your health care provider, take a vitamin C supplement.  Work with your health care provider and dietitian to develop a plan to achieve or maintain a healthy weight. Losing weight can help reduce uric acid in your blood. What foods are recommended? The items listed may not be a complete list. Talk with your dietitian about what dietary choices are best for you. Foods low in purines Foods low in purines do not need to be limited. These include:  All fruits.  All low-purine vegetables, pickles, and olives.  Breads, pasta, rice, cornbread, and popcorn. Cake and other baked goods.  All dairy foods.  Eggs, nuts, and nut butters.  Spices and condiments, such as salt, herbs, and vinegar.  Plant oils, butter, and margarine.  Water, sugar-free soft drinks, tea, coffee, and cocoa.  Vegetable-based soups, broths, sauces, and gravies. Foods moderate in purines Foods moderate in purines should be limited to the amounts listed.   cup of asparagus, cauliflower, spinach, mushrooms, or green peas, each day.  2/3 cup uncooked oatmeal, each day.   cup dry wheat bran or wheat germ, each day.  2-3 ounces of meat or poultry, each day.  4-6 ounces of shellfish, such as crab, lobster, oysters, or shrimp, each day.  1 cup cooked beans, peas, or lentils, each day.  Soup, broths, or bouillon made from meat or fish. Limit these foods as much as possible. What foods are not recommended? The items listed may not be a complete list. Talk with your dietitian about what dietary choices are best for you. Limit your intake of foods high in purines, including:  Beer and other alcohol.  Meat-based gravy or sauce.  Canned or fresh fish, such as: ? Anchovies, sardines, herring, and tuna. ? Mussels and scallops. ? Codfish, trout, and haddock.  Berniece Salines.  Organ meats, such as: ? Liver or kidney. ? Tripe. ? Sweetbreads (thymus gland or pancreas).  [employer], systems.  Yeast or yeast extract supplements.  Drinks sweetened with high-fructose corn syrup. Summary  Eating a low-purine diet can help control conditions caused by too much uric acid in the body, such as gout or kidney stones.  Choose low-purine foods, limit alcohol, and limit foods high in fat.  You will learn over time which foods do or do not affect you. If you find out that a food tends to cause your gout symptoms to flare up, avoid eating that food. This  information is not intended to replace advice given to you by your health care provider. Make sure you discuss any questions you have with your health care provider. Document Released: 06/18/2010 Document Revised: 02/03/2017 Document Reviewed: 04/06/2016 Elsevier Patient Education  2020 Elsevier Inc.  Preventive Care 75-14 Years Old, Male Preventive care refers to lifestyle choices and visits with your health care provider that can promote health and wellness. This includes:  A yearly physical exam. This is also called an annual well check.  Regular dental and eye exams.  Immunizations.  Screening for certain conditions.  Healthy lifestyle choices, such as eating a healthy diet, getting regular exercise, not using drugs or products that contain nicotine and tobacco, and limiting alcohol use. What can I expect for my preventive care visit? Physical exam Your health care provider will check:  Height and weight. These may be used to calculate body mass index (BMI), which is a measurement that tells if you are at a healthy weight.  Heart rate and blood pressure.  Your skin for abnormal spots. Counseling Your health care provider may ask you questions about:  Alcohol, tobacco, and drug use.  Emotional well-being.  Home and relationship well-being.  Sexual activity.  Eating habits.  Work and work Statistician. What immunizations do I need?  Influenza (flu) vaccine  This is recommended every year. Tetanus,  diphtheria, and pertussis (Tdap) vaccine  You may need a Td booster every 10 years. Varicella (chickenpox) vaccine  You may need this vaccine if you have not already been vaccinated. Zoster (shingles) vaccine  You may need this after age 18. Measles, mumps, and rubella (MMR) vaccine  You may need at least one dose of MMR if you were born in 1957 or later. You may also need a second dose. Pneumococcal conjugate (PCV13) vaccine  You may need this if you have certain conditions and were not previously vaccinated. Pneumococcal polysaccharide (PPSV23) vaccine  You may need one or two doses if you smoke cigarettes or if you have certain conditions. Meningococcal conjugate (MenACWY) vaccine  You may need this if you have certain conditions. Hepatitis A vaccine  You may need this if you have certain conditions or if you travel or work in places where you may be exposed to hepatitis A. Hepatitis B vaccine  You may need this if you have certain conditions or if you travel or work in places where you may be exposed to hepatitis B. Haemophilus influenzae type b (Hib) vaccine  You may need this if you have certain risk factors. Human papillomavirus (HPV) vaccine  If recommended by your health care provider, you may need three doses over 6 months. You may receive vaccines as individual doses or as more than one vaccine together in one shot (combination vaccines). Talk with your health care provider about the risks and benefits of combination vaccines. What tests do I need? Blood tests  Lipid and cholesterol levels. These may be checked every 5 years, or more frequently if you are over 48 years old.  Hepatitis C test.  Hepatitis B test. Screening  Lung cancer screening. You may have this screening every year starting at age 69 if you have a 30-pack-year history of smoking and currently smoke or have quit within the past 15 years.  Prostate cancer screening. Recommendations will vary  depending on your family history and other risks.  Colorectal cancer screening. All adults should have this screening starting at age 67 and continuing until age 83. Your health care provider  may recommend screening at age 88 if you are at increased risk. You will have tests every 1-10 years, depending on your results and the type of screening test.  Diabetes screening. This is done by checking your blood sugar (glucose) after you have not eaten for a while (fasting). You may have this done every 1-3 years.  Sexually transmitted disease (STD) testing. Follow these instructions at home: Eating and drinking  Eat a diet that includes fresh fruits and vegetables, whole grains, lean protein, and low-fat dairy products.  Take vitamin and mineral supplements as recommended by your health care provider.  Do not drink alcohol if your health care provider tells you not to drink.  If you drink alcohol: ? Limit how much you have to 0-2 drinks a day. ? Be aware of how much alcohol is in your drink. In the [place] one drink equals one 12 oz bottle of beer (355 mL), one 5 oz glass of wine (148 mL), or one 1 oz glass of hard liquor (44 mL). Lifestyle  Take daily care of your teeth and gums.  Stay active. Exercise for at least 30 minutes on 5 or more days each week.  Do not use any products that contain nicotine or tobacco, such as cigarettes, e-cigarettes, and chewing tobacco. If you need help quitting, ask your health care provider.  If you are sexually active, practice safe sex. Use a condom or other form of protection to prevent STIs (sexually transmitted infections).  Talk with your health care provider about taking a low-dose aspirin every day starting at age 49. What's next?  Go to your health care provider once a year for a well check visit.  Ask your health care provider how often you should have your eyes and teeth checked.  Stay up to date on all vaccines. This information is not  intended to replace advice given to you by your health care provider. Make sure you discuss any questions you have with your health care provider. Document Released: 03/20/2015 Document Revised: 02/15/2018 Document Reviewed: 02/15/2018 Elsevier Patient Education  2020 [employer].

## 2019-01-03 ENCOUNTER — Other Ambulatory Visit: Payer: Self-pay | Admitting: Family Medicine

## 2019-01-03 DIAGNOSIS — F411 Generalized anxiety disorder: Secondary | ICD-10-CM

## 2019-01-03 DIAGNOSIS — G4700 Insomnia, unspecified: Secondary | ICD-10-CM

## 2019-01-08 ENCOUNTER — Other Ambulatory Visit: Payer: Self-pay | Admitting: Family Medicine

## 2019-01-08 DIAGNOSIS — M109 Gout, unspecified: Secondary | ICD-10-CM

## 2021-10-13 ENCOUNTER — Encounter: Payer: Self-pay | Admitting: Gastroenterology

## 2023-11-29 ENCOUNTER — Ambulatory Visit (HOSPITAL_BASED_OUTPATIENT_CLINIC_OR_DEPARTMENT_OTHER)

## 2023-11-29 ENCOUNTER — Encounter (HOSPITAL_BASED_OUTPATIENT_CLINIC_OR_DEPARTMENT_OTHER): Payer: Self-pay

## 2023-11-29 VITALS — BP 120/74 | HR 61 | Ht 74.0 in | Wt 291.0 lb

## 2023-11-29 DIAGNOSIS — Z87891 Personal history of nicotine dependence: Secondary | ICD-10-CM | POA: Diagnosis not present

## 2023-11-29 DIAGNOSIS — G4733 Obstructive sleep apnea (adult) (pediatric): Secondary | ICD-10-CM | POA: Diagnosis not present

## 2023-11-29 NOTE — Progress Notes (Signed)

## 2023-11-29 NOTE — Progress Notes (Signed)
 @Patient  ID: Corey Garrett, male    DOB: [birth year], 58 y.o.   MRN: [id number]  Chief Complaint  Patient presents with   Establish Care    New sleep     Referring provider: Nestor Elston NOVAK, NP  HPI: Corey ONEIDA. Rood is a 58 y/o male with PMH of BCC, hypersomnolence, depression/anxiety, and morbid obesity who presents as a new patient for evaluation of sleep.  Home sleep test was completed 08/22/2018 with mild OSA and AHI of 7/hr and O2 sat nadir of 78%.  He reports that he was not interested in starting CPAP and did not start CPAP at that time.  He did visit an orthodontist and was fitted with a mouthpiece.  Despite this it kept breaking every night and he was unable to use it.  He was referred to maxillofacial surgery and they had recommended a procedure to reset his jaw.  This was around the time of the COVID outbreak and he was unfortunately unable to complete any of this.  He has had a fluctuation in his weight up by about 10 to 20 pounds since the time of his last sleep study.  He continues to have daytime sleepiness and has frequent naps.  He also snores quite a bit.  He denies fever, chills, night sweats, chest pain, headaches, dizziness, lightheadedness, or other complaints.  TEST/EVENTS :  [facility]:  Home sleep test:  08/22/2018 with mild OSA and AHI of 7/hr and O2 sat nadir of 78%.  No Known Allergies  Immunization History  Administered Date(s) Administered   Td 03/13/2006   Tdap 11/11/2016    Past Medical History:  Diagnosis Date   ANXIETY 10/07/2006   Basal cell carcinoma    right shoulder blade   COLONIC POLYPS, HX OF 07/10/2007   DEPRESSION 12/29/2008   GOUT 07/10/2007   HEMORRHOIDS, HX OF 10/07/2006   Hyperlipidemia    Hyperuricemia 04/03/2013   Skin tag of anus     Tobacco History: Social History   Tobacco Use  Smoking Status Former   Types: Cigars  Smokeless Tobacco Never  Tobacco Comments   Occasional cigar.   Counseling given: Not  Answered Tobacco comments: Occasional cigar.   Outpatient Medications Prior to Visit  Medication Sig Dispense Refill   allopurinol  (ZYLOPRIM ) 300 MG tablet TAKE 1 TABLET BY MOUTH EVERY DAY 90 tablet 1   fenofibrate  micronized (LOFIBRA) 200 MG capsule TAKE 1 CAPSULE (200 MG TOTAL) BY MOUTH DAILY BEFORE BREAKFAST. 90 capsule 1   FLUoxetine  (PROZAC ) 40 MG capsule TAKE 1 CAPSULE BY MOUTH EVERY DAY 90 capsule 1   Multiple Vitamin (MULTIVITAMIN) tablet Take 1 tablet by mouth daily.     traZODone  (DESYREL ) 50 MG tablet TAKE 1 TABLET (50 MG TOTAL) BY MOUTH AT BEDTIME AS NEEDED FOR SLEEP. 90 tablet 1   No facility-administered medications prior to visit.     Review of Systems: Positive as per HPI  Constitutional:   No  weight loss, night sweats,  Fevers, chills, fatigue, or  lassitude.  HEENT:   No headaches,  Difficulty swallowing,  Tooth/dental problems, or  Sore throat,                No sneezing, itching, ear ache, nasal congestion, post nasal drip,   CV:  No chest pain,  Orthopnea, PND, swelling in lower extremities, anasarca, dizziness, palpitations, syncope.   GI  No heartburn, indigestion, abdominal pain, nausea, vomiting, diarrhea, change in bowel habits, loss of appetite, bloody  stools.   Resp: No shortness of breath with exertion or at rest.  No excess mucus, no productive cough,  No non-productive cough,  No coughing up of blood.  No change in color of mucus.  No wheezing.  No chest wall deformity  Skin: no rash or lesions.  GU: no dysuria, change in color of urine, no urgency or frequency.  No flank pain, no hematuria   MS:  No joint pain or swelling.  No decreased range of motion.  No back pain.    Physical Exam  BP 120/74   Pulse 61   Ht 6' 2 (1.88 m)   Wt 291 lb (132 kg)   SpO2 94%   BMI 37.36 kg/m   GEN: A/Ox3; pleasant , NAD, well nourished    HEENT:  [place]/AT,  EACs-clear, TMs-wnl, NOSE-clear, THROAT-clear, no lesions, no postnasal drip or exudate noted.   Mallampati 4  NECK:  Supple w/ fair ROM; no JVD; normal carotid impulses w/o bruits; no thyromegaly or nodules palpated; no lymphadenopathy.    RESP  Clear  P & A; w/o, wheezes/ rales/ or rhonchi. no accessory muscle use, no dullness to percussion  CARD:  RRR, no m/r/g, no peripheral edema, pulses intact, no cyanosis or clubbing.  GI:   Soft & nt; nml bowel sounds; no organomegaly or masses detected.   Musco: Warm bil, no deformities or joint swelling noted.   Neuro: alert, no focal deficits noted.    Skin: Warm, no lesions or rashes    Lab Results:  CBC    Component Value Date/Time   WBC 7.1 11/23/2018 0832   RBC 5.08 11/23/2018 0832   HGB 14.4 11/23/2018 0832   HCT 42.5 11/23/2018 0832   PLT 189.0 11/23/2018 0832   MCV 83.6 11/23/2018 0832   MCH 27.8 01/04/2014 0942   MCHC 33.8 11/23/2018 0832   RDW 14.9 11/23/2018 0832   LYMPHSABS 1.6 11/11/2016 0932   MONOABS 0.4 11/11/2016 0932   EOSABS 0.2 11/11/2016 0932   BASOSABS 0.0 11/11/2016 0932    BMET    Component Value Date/Time   NA 141 11/23/2018 0832   K 4.0 11/23/2018 0832   CL 105 11/23/2018 0832   CO2 29 11/23/2018 0832   GLUCOSE 98 11/23/2018 0832   BUN 21 11/23/2018 0832   CREATININE 1.06 11/23/2018 0832   CREATININE 0.95 01/04/2014 0942   CALCIUM 9.7 11/23/2018 0832   GFRNONAA 97.62 12/29/2008 0857   GFRAA 106 10/13/2006 1004    BNP No results found for: BNP  ProBNP No results found for: PROBNP  Imaging: No results found.  Administration History     None           No data to display          No results found for: NITRICOXIDE   Assessment & Plan:   Assessment & Plan OSA (obstructive sleep apnea) - Given history of mild OSA on prior HST, continued snoring and excessive daytime sleepiness the concern is for ongoing sleep apnea with unknown severity. -Plan for home sleep test to further quantify his sleep apnea. -He reports that he does not want to try the CPAP device nor  the inspire device and is more interested in a referral to maxillofacial surgery. Plan to readdress this after sleep study results are completed.   Return in about 8 weeks (around 01/24/2024) for sleep study review.  Candis Dandy, PA-C 11/29/2023

## 2023-11-29 NOTE — Patient Instructions (Signed)
 Complete home sleep test as ordered.  Follow up in 6-8 weeks to review results.

## 2023-12-16 ENCOUNTER — Encounter

## 2023-12-16 DIAGNOSIS — G4733 Obstructive sleep apnea (adult) (pediatric): Secondary | ICD-10-CM

## 2023-12-23 ENCOUNTER — Encounter (HOSPITAL_BASED_OUTPATIENT_CLINIC_OR_DEPARTMENT_OTHER): Payer: Self-pay

## 2023-12-25 NOTE — Telephone Encounter (Signed)
 Can you help with this?

## 2024-01-01 DIAGNOSIS — R069 Unspecified abnormalities of breathing: Secondary | ICD-10-CM | POA: Diagnosis not present

## 2024-01-03 NOTE — Telephone Encounter (Signed)
 Spoke with Delon from [facility] and spoke with Mr. Brendle. She stated it was the patient's wife who had the questions, about letter from insurance. The SNAP HST is covered under patient's insurance. What was received in the mail was not a bill but an EOB (Explanation of Benefits). Patient acknowledge understanding. Nothing further is needed at this time.

## 2024-01-18 ENCOUNTER — Encounter (HOSPITAL_BASED_OUTPATIENT_CLINIC_OR_DEPARTMENT_OTHER): Payer: Self-pay

## 2024-01-31 ENCOUNTER — Ambulatory Visit (HOSPITAL_BASED_OUTPATIENT_CLINIC_OR_DEPARTMENT_OTHER)
